# Patient Record
Sex: Male | Born: 1970 | Race: White | Hispanic: No | Marital: Married | Smoking: Former smoker
Health system: Southern US, Community
[De-identification: ages and names within clinical notes are randomized; demographics above are authoritative.]

## PROBLEM LIST (undated history)

## (undated) DIAGNOSIS — R7989 Other specified abnormal findings of blood chemistry: Secondary | ICD-10-CM

## (undated) DIAGNOSIS — R945 Abnormal results of liver function studies: Secondary | ICD-10-CM

## (undated) DIAGNOSIS — T7840XA Allergy, unspecified, initial encounter: Secondary | ICD-10-CM

## (undated) DIAGNOSIS — N200 Calculus of kidney: Secondary | ICD-10-CM

## (undated) DIAGNOSIS — A630 Anogenital (venereal) warts: Secondary | ICD-10-CM

## (undated) DIAGNOSIS — E663 Overweight: Secondary | ICD-10-CM

## (undated) DIAGNOSIS — N62 Hypertrophy of breast: Secondary | ICD-10-CM

## (undated) DIAGNOSIS — J45909 Unspecified asthma, uncomplicated: Secondary | ICD-10-CM

## (undated) HISTORY — DX: Other specified abnormal findings of blood chemistry: R79.89

## (undated) HISTORY — DX: Unspecified asthma, uncomplicated: J45.909

## (undated) HISTORY — PX: HERNIA REPAIR: SHX51

## (undated) HISTORY — DX: Abnormal results of liver function studies: R94.5

## (undated) HISTORY — DX: Calculus of kidney: N20.0

## (undated) HISTORY — DX: Overweight: E66.3

## (undated) HISTORY — DX: Allergy, unspecified, initial encounter: T78.40XA

## (undated) HISTORY — DX: Anogenital (venereal) warts: A63.0

## (undated) HISTORY — DX: Hypertrophy of breast: N62

---

## 2005-02-09 ENCOUNTER — Emergency Department (HOSPITAL_COMMUNITY): Admission: EM | Admit: 2005-02-09 | Discharge: 2005-02-09 | Payer: Self-pay | Admitting: Emergency Medicine

## 2009-10-24 ENCOUNTER — Encounter: Admission: RE | Admit: 2009-10-24 | Discharge: 2009-10-24 | Payer: Self-pay | Admitting: Emergency Medicine

## 2011-02-08 ENCOUNTER — Ambulatory Visit (INDEPENDENT_AMBULATORY_CARE_PROVIDER_SITE_OTHER): Payer: BC Managed Care – PPO

## 2011-02-08 DIAGNOSIS — Z23 Encounter for immunization: Secondary | ICD-10-CM

## 2011-02-24 ENCOUNTER — Encounter: Payer: Self-pay | Admitting: *Deleted

## 2011-02-24 ENCOUNTER — Emergency Department (HOSPITAL_COMMUNITY)
Admission: EM | Admit: 2011-02-24 | Discharge: 2011-02-24 | Disposition: A | Discharge status: Home / Self Care | Payer: BC Managed Care – PPO | Attending: Emergency Medicine | Admitting: Emergency Medicine

## 2011-02-24 DIAGNOSIS — S61409A Unspecified open wound of unspecified hand, initial encounter: Secondary | ICD-10-CM | POA: Insufficient documentation

## 2011-02-24 DIAGNOSIS — S61411A Laceration without foreign body of right hand, initial encounter: Secondary | ICD-10-CM

## 2011-02-24 DIAGNOSIS — W268XXA Contact with other sharp object(s), not elsewhere classified, initial encounter: Secondary | ICD-10-CM | POA: Insufficient documentation

## 2011-02-24 MED ORDER — LIDOCAINE-EPINEPHRINE 2 %-1:100000 IJ SOLN
30.0000 mL | Freq: Once | INTRAMUSCULAR | Status: AC
  Administered 2011-02-24: 30 mL

## 2011-02-24 NOTE — ED Provider Notes (Signed)
History     CSN: [id number]  Arrival date & time 02/24/11  0022   First MD Initiated Contact with Patient 02/24/11 0309      Chief Complaint  Patient presents with  . Extremity Laceration    (Consider location/radiation/quality/duration/timing/severity/associated sxs/prior treatment) The history is provided by the patient.   the patient reports laceration to his right thumb approximately 4 hours ago when he was fixing his stove.  His tetanus was last updated in 2008.  Complains of no weakness numbness or tingling of his right thumb.  He reports a small amount of bleeding.  He has no known medical problems.  His had no difficulty flexing or extending his right thumb.  He has no other complaints.   History reviewed. No pertinent past medical history.  History reviewed. No pertinent past surgical history.  History reviewed. No pertinent family history.  History  Substance Use Topics  . Smoking status: Never Smoker   . Smokeless tobacco: Not on file  . Alcohol Use: Yes      Review of Systems  All other systems reviewed and are negative.    Allergies  Review of patient's allergies indicates no known allergies.  Home Medications  No current outpatient prescriptions on file.  BP 171/85  Pulse 90  Temp(Src) 98 F (36.7 C) (Oral)  Resp 20  SpO2 98%  Physical Exam  Constitutional: He is oriented to person, place, and time. He appears well-developed and well-nourished.  HENT:  Head: Normocephalic.  Eyes: EOM are normal.  Neck: Normal range of motion.  Pulmonary/Chest: Effort normal.  Musculoskeletal: Normal range of motion.       Patient with small V-shaped laceration overlying his proximal phalanx of his right thumb noted distal aspect.  It does appear to be on the radial side.  The laceration was evaluated at the bedside and explored locally without obvious involvement of flexor tendon or extensor tendon.  He has normal flexion of his right thumb at both the IP and  the MCP joint and normal extension at these joints as well.  He has normal distal perfusion and no numbness.  There is no active bleeding at this time  Neurological: He is alert and oriented to person, place, and time.  Psychiatric: He has a normal mood and affect.    ED Course  Procedures (including critical care time)  LACERATION REPAIR Performed by: Lyanne Co Consent: Verbal consent obtained. Risks and benefits: risks, benefits and alternatives were discussed Patient identity confirmed: provided demographic data Time out performed prior to procedure Prepped and Draped in normal sterile fashion Wound explored Laceration Location: right thumb Laceration Length: 2.5cm No Foreign Bodies seen or palpated Anesthesia: local infiltration Local anesthetic: lidocaine 2% with epinephrine Anesthetic total: 3 ml Irrigation method: syringe Amount of cleaning: standard Skin closure: 4-0 prolene Number of sutures or staples: 4 Technique: simple interrupted Patient tolerance: Patient tolerated the procedure well with no immediate complications.   Labs Reviewed - No data to display No results found.   1. Laceration of right hand       MDM  Laceration repaired.  Infection warnings given.  Tetanus up to date.  No tendon involvement.  No foreign bodies noted        Lyanne Co, MD 02/24/11 [phone]

## 2011-02-24 NOTE — ED Notes (Signed)
Pt in c/o laceration to right thumb, bleeding controlled

## 2011-02-24 NOTE — ED Notes (Signed)
MD at bedside. 

## 2011-03-05 ENCOUNTER — Ambulatory Visit (INDEPENDENT_AMBULATORY_CARE_PROVIDER_SITE_OTHER): Payer: BC Managed Care – PPO

## 2011-03-05 DIAGNOSIS — Z4802 Encounter for removal of sutures: Secondary | ICD-10-CM

## 2011-03-05 DIAGNOSIS — S61209A Unspecified open wound of unspecified finger without damage to nail, initial encounter: Secondary | ICD-10-CM

## 2011-05-17 ENCOUNTER — Encounter: Payer: Self-pay | Admitting: Family Medicine

## 2011-05-17 ENCOUNTER — Ambulatory Visit (INDEPENDENT_AMBULATORY_CARE_PROVIDER_SITE_OTHER): Payer: BC Managed Care – PPO | Admitting: Family Medicine

## 2011-05-17 VITALS — BP 132/91 | HR 68 | Temp 98.0°F | Resp 16 | Ht 69.0 in | Wt 269.0 lb

## 2011-05-17 DIAGNOSIS — E669 Obesity, unspecified: Secondary | ICD-10-CM

## 2011-05-17 DIAGNOSIS — R600 Localized edema: Secondary | ICD-10-CM

## 2011-05-17 DIAGNOSIS — Z0000 Encounter for general adult medical examination without abnormal findings: Secondary | ICD-10-CM

## 2011-05-17 LAB — POCT UA - MICROSCOPIC ONLY
Bacteria, U Microscopic: NEGATIVE
Casts, Ur, LPF, POC: NEGATIVE
Crystals, Ur, HPF, POC: NEGATIVE
Mucus, UA: NEGATIVE
WBC, Ur, HPF, POC: NEGATIVE
Yeast, UA: NEGATIVE

## 2011-05-17 LAB — CBC WITH DIFFERENTIAL/PLATELET
Basophils Absolute: 0 10*3/uL (ref 0.0–0.1)
Basophils Relative: 0 % (ref 0–1)
Eosinophils Absolute: 0.1 10*3/uL (ref 0.0–0.7)
Eosinophils Relative: 2 % (ref 0–5)
HCT: 45.4 % (ref 39.0–52.0)
Hemoglobin: 15.7 g/dL (ref 13.0–17.0)
Lymphocytes Relative: 29 % (ref 12–46)
Lymphs Abs: 1.5 10*3/uL (ref 0.7–4.0)
MCH: 29.3 pg (ref 26.0–34.0)
MCHC: 34.6 g/dL (ref 30.0–36.0)
MCV: 84.9 fL (ref 78.0–100.0)
Monocytes Absolute: 0.3 10*3/uL (ref 0.1–1.0)
Monocytes Relative: 7 % (ref 3–12)
Neutro Abs: 3.2 10*3/uL (ref 1.7–7.7)
Neutrophils Relative %: 62 % (ref 43–77)
Platelets: 231 10*3/uL (ref 150–400)
RBC: 5.35 MIL/uL (ref 4.22–5.81)
RDW: 12.7 % (ref 11.5–15.5)
WBC: 5.1 10*3/uL (ref 4.0–10.5)

## 2011-05-17 LAB — POCT URINALYSIS DIPSTICK
Bilirubin, UA: NEGATIVE
Blood, UA: NEGATIVE
Glucose, UA: NEGATIVE
Ketones, UA: NEGATIVE
Leukocytes, UA: NEGATIVE
Nitrite, UA: NEGATIVE
Protein, UA: NEGATIVE
Spec Grav, UA: <=1.005
Urobilinogen, UA: 0.2
pH, UA: 6

## 2011-05-17 LAB — POCT GLYCOSYLATED HEMOGLOBIN (HGB A1C): Hemoglobin A1C: 5.2

## 2011-05-17 NOTE — Progress Notes (Signed)
41 yo married man with two girls (4 and 15 mos), sedentary job.  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  Patient seems committed to this and will begin an exercise program now.  Plan: Check routine labs  Followup 6 months

## 2011-05-17 NOTE — Patient Instructions (Signed)
Health Maintenance, Males A healthy lifestyle and preventative care can promote health and wellness.  Maintain regular health, dental, and eye exams.   Eat a healthy diet. Foods like vegetables, fruits, whole grains, low-fat dairy products, and lean protein foods contain the nutrients you need without too many calories. Decrease your intake of foods high in solid fats, added sugars, and salt. Get information about a proper diet from your caregiver, if necessary.   Regular physical exercise is one of the most important things you can do for your health. Most adults should get at least 150 minutes of moderate-intensity exercise (any activity that increases your heart rate and causes you to sweat) each week. In addition, most adults need muscle-strengthening exercises on 2 or more days a week.    Maintain a healthy weight. The body mass index (BMI) is a screening tool to identify possible weight problems. It provides an estimate of body fat based on height and weight. Your caregiver can help determine your BMI, and can help you achieve or maintain a healthy weight. For adults 20 years and older:   A BMI below 18.5 is considered underweight.   A BMI of 18.5 to 24.9 is normal.   A BMI of 25 to 29.9 is considered overweight.   A BMI of 30 and above is considered obese.   Maintain normal blood lipids and cholesterol by exercising and minimizing your intake of saturated fat. Eat a balanced diet with plenty of fruits and vegetables. Blood tests for lipids and cholesterol should begin at age 20 and be repeated every 5 years. If your lipid or cholesterol levels are high, you are over 50, or you are a high risk for heart disease, you may need your cholesterol levels checked more frequently.Ongoing high lipid and cholesterol levels should be treated with medicines, if diet and exercise are not effective.   If you smoke, find out from your caregiver how to quit. If you do not use tobacco, do not start.    If you choose to drink alcohol, do not exceed 2 drinks per day. One drink is considered to be 12 ounces (355 mL) of beer, 5 ounces (148 mL) of wine, or 1.5 ounces (44 mL) of liquor.   Avoid use of street drugs. Do not share needles with anyone. Ask for help if you need support or instructions about stopping the use of drugs.   High blood pressure causes heart disease and increases the risk of stroke. Blood pressure should be checked at least every 1 to 2 years. Ongoing high blood pressure should be treated with medicines if weight loss and exercise are not effective.   If you are 45 to 40 years old, ask your caregiver if you should take aspirin to prevent heart disease.   Diabetes screening involves taking a blood sample to check your fasting blood sugar level. This should be done once every 3 years, after age 45, if you are within normal weight and without risk factors for diabetes. Testing should be considered at a younger age or be carried out more frequently if you are overweight and have at least 1 risk factor for diabetes.   Colorectal cancer can be detected and often prevented. Most routine colorectal cancer screening begins at the age of 50 and continues through age 75. However, your caregiver may recommend screening at an earlier age if you have risk factors for colon cancer. On a yearly basis, your caregiver may provide home test kits to check for hidden   blood in the stool. Use of a small camera at the end of a tube, to directly examine the colon (sigmoidoscopy or colonoscopy), can detect the earliest forms of colorectal cancer. Talk to your caregiver about this at age 50, when routine screening begins. Direct examination of the colon should be repeated every 5 to 10 years through age 75, unless early forms of pre-cancerous polyps or small growths are found.   Hepatitis C blood testing is recommended for all people born from 1945 through 1965 and any individual with known risks for  hepatitis C.   Healthy men should no longer receive prostate-specific antigen (PSA) blood tests as part of routine cancer screening. Consult with your caregiver about prostate cancer screening.   Testicular cancer screening is not recommended for adolescents or adult males who have no symptoms. Screening includes self-exam, caregiver exam, and other screening tests. Consult with your caregiver about any symptoms you have or any concerns you have about testicular cancer.   Practice safe sex. Use condoms and avoid high-risk sexual practices to reduce the spread of sexually transmitted infections (STIs).   Use sunscreen with a sun protection factor (SPF) of 30 or greater. Apply sunscreen liberally and repeatedly throughout the day. You should seek shade when your shadow is shorter than you. Protect yourself by wearing long sleeves, pants, a wide-brimmed hat, and sunglasses year round, whenever you are outdoors.   Notify your caregiver of new moles or changes in moles, especially if there is a change in shape or color. Also notify your caregiver if a mole is larger than the size of a pencil eraser.   A one-time screening for abdominal aortic aneurysm (AAA) and surgical repair of large AAAs by sound wave imaging (ultrasonography) is recommended for ages 65 to 75 years who are current or former smokers.   Stay current with your immunizations.  Document Released: 08/11/2007 Document Revised: 02/01/2011 Document Reviewed: 07/10/2010 ExitCare Patient Information 2012 [employer]. 

## 2011-05-18 LAB — BASIC METABOLIC PANEL
BUN: 11 mg/dL (ref 6–23)
CO2: 25 mEq/L (ref 19–32)
Calcium: 8.9 mg/dL (ref 8.4–10.5)
Chloride: 103 mEq/L (ref 96–112)
Creat: 0.79 mg/dL (ref 0.50–1.35)
Glucose, Bld: 87 mg/dL (ref 70–99)
Potassium: 4 mEq/L (ref 3.5–5.3)
Sodium: 138 mEq/L (ref 135–145)

## 2011-05-18 LAB — LIPID PANEL
Cholesterol: 185 mg/dL (ref 0–200)
HDL: 41 mg/dL (ref >39)
LDL Cholesterol: 123 mg/dL — ABNORMAL HIGH (ref 0–99)
Total CHOL/HDL Ratio: 4.5 Ratio
Triglycerides: 106 mg/dL (ref <150)
VLDL: 21 mg/dL (ref 0–40)

## 2011-05-18 LAB — TSH: TSH: 0.868 u[IU]/mL (ref 0.350–4.500)

## 2012-03-18 ENCOUNTER — Encounter: Payer: BC Managed Care – PPO | Admitting: Family Medicine

## 2012-04-11 ENCOUNTER — Encounter: Payer: BC Managed Care – PPO | Admitting: Family Medicine

## 2012-04-15 ENCOUNTER — Ambulatory Visit (INDEPENDENT_AMBULATORY_CARE_PROVIDER_SITE_OTHER): Payer: BC Managed Care – PPO | Admitting: Family Medicine

## 2012-04-15 VITALS — BP 134/82 | HR 75 | Temp 98.2°F | Resp 18 | Ht 69.0 in | Wt 271.4 lb

## 2012-04-15 DIAGNOSIS — M549 Dorsalgia, unspecified: Secondary | ICD-10-CM

## 2012-04-15 DIAGNOSIS — Z0000 Encounter for general adult medical examination without abnormal findings: Secondary | ICD-10-CM

## 2012-04-15 LAB — COMPREHENSIVE METABOLIC PANEL
ALT: 71 U/L — ABNORMAL HIGH (ref 0–53)
AST: 38 U/L — ABNORMAL HIGH (ref 0–37)
CO2: 27 mEq/L (ref 19–32)
Calcium: 9.6 mg/dL (ref 8.4–10.5)
Chloride: 102 mEq/L (ref 96–112)
Sodium: 138 mEq/L (ref 135–145)
Total Bilirubin: 0.7 mg/dL (ref 0.3–1.2)
Total Protein: 7.5 g/dL (ref 6.0–8.3)

## 2012-04-15 LAB — LIPID PANEL
Cholesterol: 195 mg/dL (ref 0–200)
Total CHOL/HDL Ratio: 4.6 Ratio
VLDL: 19 mg/dL (ref 0–40)

## 2012-04-15 LAB — POCT UA - MICROSCOPIC ONLY: Yeast, UA: NEGATIVE

## 2012-04-15 LAB — POCT CBC
Granulocyte percent: 62 %G (ref 37–80)
HCT, POC: 51 % (ref 43.5–53.7)
MID (cbc): 0.3 (ref 0–0.9)
MPV: 10.4 fL (ref 0–99.8)
POC Granulocyte: 4.3 (ref 2–6.9)
POC LYMPH PERCENT: 33 %L (ref 10–50)
POC MID %: 5 %M (ref 0–12)
Platelet Count, POC: 275 10*3/uL (ref 142–424)
RDW, POC: 13.7 %

## 2012-04-15 LAB — POCT URINALYSIS DIPSTICK
Ketones, UA: 15
Leukocytes, UA: NEGATIVE
Nitrite, UA: NEGATIVE

## 2012-04-15 LAB — IFOBT (OCCULT BLOOD): IFOBT: NEGATIVE

## 2012-04-15 NOTE — Patient Instructions (Signed)
Work hard on weight loss.  Exercise.  I will let you know the results of your labs.

## 2012-04-15 NOTE — Progress Notes (Signed)
Physical examination:   History: Patient is here for physical examination. He has a form that his job place requires for him to get filled out for strep a certain point system the day following monitoring health status.  Past medical history: Operations: Hernia Medical illnesses: None Current medications: None Medication allergies: None  Family history: Father possibly has diabetes. They are estranged. The father lives in [place]. Mother is living and well. He has 2 children are living and well.   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 His wife is working on a IT trainer. They have 2 children.  Review of systems: Constitutional: Unremarkable HEENT: Unremarkable Respiratory: Unremarkable Cardiovascular: Unremarkable GI: Has a lot of rumbling borborygma. Genitourinary: Unremarkable Muscular skeletal: Unremarkable Dermatologic: Unremarkable Neurologic: Unremarkable Psychiatric: Unremarkable   Physical examination: Morbidly obese man in no major distress. His TMs are normal. Eyes PERRLA. Fundi benign. He has fairly quite prominent discs, it was sent to an ophthalmologist before and was told this was normal. His throat is clear. Teeth good. Neck supple without nodes thyromegaly. No carotid bruits. Chest clear to auscultation. Heart regular without murmurs gallops or arrhythmias. Abdomen soft without mass or tenderness. Normal male external genitalia testes descended. No hernias. Skin warm and dry. Digital rectal exam his prostate gland to be normal with no nodules. He said he had some hemorrhoids in the past but I do not feel any this time. Extremities unremarkable. Pedal pulses good. Skin normal.  Assessment: Complete physical examination Obesity   Plan: Results for orders placed in visit on 04/15/12  POCT CBC      Result Value Range   WBC 6.9  4.6 - 10.2 K/uL   Lymph, poc 2.3  0.6 - 3.4   POC LYMPH PERCENT 33.0  10 - 50 %L    MID (cbc) 0.3  0 - 0.9   POC MID % 5.0  0 - 12 %M   POC Granulocyte 4.3  2 - 6.9   Granulocyte percent 62.0  37 - 80 %G   RBC 5.75  4.69 - 6.13 M/uL   Hemoglobin 16.9  14.1 - 18.1 g/dL   HCT, POC 40.9  81.1 - 53.7 %   MCV 88.7  80 - 97 fL   MCH, POC 29.4  27 - 31.2 pg   MCHC 33.1  31.8 - 35.4 g/dL   RDW, POC 91.4     Platelet Count, POC 275  142 - 424 K/uL   MPV 10.4  0 - 99.8 fL  POCT UA - MICROSCOPIC ONLY      Result Value Range   WBC, Ur, HPF, POC 0-1     RBC, urine, microscopic 0-1     Bacteria, U Microscopic negative     Mucus, UA negative     Epithelial cells, urine per micros 0-1     Crystals, Ur, HPF, POC negative     Casts, Ur, LPF, POC negative     Yeast, UA negative    POCT URINALYSIS DIPSTICK      Result Value Range   Color, UA yellow     Clarity, UA clear     Glucose, UA negative     Bilirubin, UA small     Ketones, UA 15     Spec Grav, UA 1.025     Blood, UA trace-intact     pH, UA 5.5     Protein, UA negative     Urobilinogen,  UA 0.2     Nitrite, UA negative     Leukocytes, UA Negative    IFOBT (OCCULT BLOOD)      Result Value Range   IFOBT Negative

## 2012-04-16 ENCOUNTER — Telehealth: Payer: Self-pay | Admitting: Family Medicine

## 2012-04-16 NOTE — Telephone Encounter (Signed)
Not sure why this stayed open.

## 2012-04-17 ENCOUNTER — Encounter: Payer: Self-pay | Admitting: Family Medicine

## 2012-05-26 ENCOUNTER — Ambulatory Visit (INDEPENDENT_AMBULATORY_CARE_PROVIDER_SITE_OTHER): Payer: BC Managed Care – PPO | Admitting: Family Medicine

## 2012-05-26 ENCOUNTER — Ambulatory Visit: Payer: BC Managed Care – PPO

## 2012-05-26 VITALS — BP 150/92 | HR 88 | Temp 97.9°F | Resp 20

## 2012-05-26 DIAGNOSIS — M549 Dorsalgia, unspecified: Secondary | ICD-10-CM

## 2012-05-26 MED ORDER — CYCLOBENZAPRINE HCL 10 MG PO TABS: 10.0000 mg | ORAL_TABLET | Freq: Three times a day (TID) | ORAL | Status: DC | PRN

## 2012-05-26 MED ORDER — IBUPROFEN 800 MG PO TABS: 800.0000 mg | ORAL_TABLET | Freq: Three times a day (TID) | ORAL | Status: DC | PRN

## 2012-05-26 NOTE — Progress Notes (Signed)
Subjective:    Patient ID: Mathew Greer, male    DOB: Apr 26, 1970, 42 y.o.   MRN: [id number]  HPI This 42 y.o. male presents for evaluation of low back pain.  Symptoms began this morning at work as he rose from a sitting position.  His pain was minimal while he drove home, met his wife and presented to the office.  He was comfortable lying on a bench in our waiting room, but developed severe pain upon rising.  The pain is coming in waves of spasm, with periods of no or minimal pain between.  No saddle anesthesia, radicular pain, paresthesias, loss of bowel or bladder control. Pain occurs with raising the RIGHT leg.  Similar event x 2 previously, most recently in 2007.  Improved with ibuprofen and PT.   Past Medical History  Diagnosis Date  . Overweight   . Allergy     Past Surgical History  Procedure Laterality Date  . Hernia repair  2nd grade    Prior to Admission medications   Medication Sig Start Date End Date Taking? Authorizing Provider  fexofenadine-pseudoephedrine (ALLEGRA-D 24) 180-240 MG per 24 hr tablet Take 1 tablet by mouth daily.   Yes Historical Provider, MD  ibuprofen (ADVIL,MOTRIN) 200 MG tablet Take 200 mg by mouth every 6 (six) hours as needed for pain.    Historical Provider, MD    No Known Allergies  History   Social History  . Marital Status: Married    Spouse Name: Maralyn Sago    Number of Children: 2  . Years of Education: 18+   Occupational History  . Geographical Information  Science    Social History Main Topics  . Smoking status: Former Games developer  . Smokeless tobacco: Never Used  . Alcohol Use: 1.8 oz/week    3 Glasses of wine per week  . Drug Use: No  . Sexually Active: Yes -- Male partner(s)    Birth Control/ Protection: None   Other Topics Concern  . Not on file   Social History Narrative   Working on PhD.  Lives with his wife and their 2 daughters.    Family History  Problem Relation Age of Onset  . Diabetes Father   . Stroke Maternal  Grandmother   . Cancer Maternal Grandfather     lung  . Alzheimer's disease Paternal Grandmother   . Cancer Paternal Grandfather     lung     Review of Systems As above    Objective:   Physical Exam  Blood pressure 150/92, pulse 88, temperature 97.9 F (36.6 C), temperature source Oral, resp. rate 20, SpO2 98.00%. There is no weight on file to calculate BMI. Well-developed, well nourished WM who is awake, alert and oriented, in obvious discomfort.  He stands perched against the exam table, moves gingerly, and intermittently cries out in pain. HEENT: Wiley/AT, sclera and conjunctiva are clear.   Heart: RRR, no murmur Lungs: normal effort, CTA Back: Tenderness of the lumbosacral back, over the vertebrae and to the RIGHT. Neurologic:  Patellar reflexes are symmetrically strong, lower extremity sensation is intact.  Good strength.  Straight leg raise causes pain in the back, but no radicular symptoms. Extremities: no cyanosis, clubbing or edema. Skin: warm and dry without rash. Psychologic: good mood and appropriate affect, normal speech and behavior.   [facility] reading (PRIMARY) by  Dr. Patsy Lager.  Normal LS Spine.      Assessment & Plan:  Acute back pain/strain - Plan: DG Lumbar Spine Complete, cyclobenzaprine (FLEXERIL) 10  MG tablet, ibuprofen (ADVIL,MOTRIN) 800 MG tablet, Ambulatory referral to Physical Therapy.  Anticipatory guidance provided.

## 2012-05-26 NOTE — Patient Instructions (Signed)
If you have not heard anything regarding the referral in 3 days, please contact our office.

## 2012-10-14 ENCOUNTER — Encounter: Payer: Self-pay | Admitting: Internal Medicine

## 2013-03-30 ENCOUNTER — Encounter: Payer: Self-pay | Admitting: Family Medicine

## 2013-03-30 ENCOUNTER — Ambulatory Visit (INDEPENDENT_AMBULATORY_CARE_PROVIDER_SITE_OTHER): Payer: BC Managed Care – PPO | Admitting: Family Medicine

## 2013-03-30 VITALS — BP 134/88 | HR 74 | Temp 98.2°F | Resp 16 | Ht 69.0 in | Wt 267.6 lb

## 2013-03-30 DIAGNOSIS — Z0000 Encounter for general adult medical examination without abnormal findings: Secondary | ICD-10-CM

## 2013-03-30 DIAGNOSIS — Z23 Encounter for immunization: Secondary | ICD-10-CM

## 2013-03-30 LAB — CBC
HCT: 46.5 % (ref 39.0–52.0)
Hemoglobin: 16.2 g/dL (ref 13.0–17.0)
MCH: 29.6 pg (ref 26.0–34.0)
MCHC: 34.8 g/dL (ref 30.0–36.0)
MCV: 85 fL (ref 78.0–100.0)
Platelets: 202 10*3/uL (ref 150–400)
RBC: 5.47 MIL/uL (ref 4.22–5.81)
RDW: 13.2 % (ref 11.5–15.5)
WBC: 5.4 10*3/uL (ref 4.0–10.5)

## 2013-03-30 LAB — COMPREHENSIVE METABOLIC PANEL
ALT: 74 U/L — ABNORMAL HIGH (ref 0–53)
AST: 37 U/L (ref 0–37)
Albumin: 4.6 g/dL (ref 3.5–5.2)
Alkaline Phosphatase: 90 U/L (ref 39–117)
BUN: 15 mg/dL (ref 6–23)
CO2: 24 mEq/L (ref 19–32)
Calcium: 9.6 mg/dL (ref 8.4–10.5)
Chloride: 107 mEq/L (ref 96–112)
Creat: 0.73 mg/dL (ref 0.50–1.35)
Glucose, Bld: 97 mg/dL (ref 70–99)
Potassium: 4.5 mEq/L (ref 3.5–5.3)
Sodium: 141 mEq/L (ref 135–145)
Total Bilirubin: 0.6 mg/dL (ref 0.2–1.2)
Total Protein: 7.2 g/dL (ref 6.0–8.3)

## 2013-03-30 LAB — LIPID PANEL
Cholesterol: 188 mg/dL (ref 0–200)
HDL: 47 mg/dL (ref >39)
LDL Cholesterol: 117 mg/dL — ABNORMAL HIGH (ref 0–99)
Total CHOL/HDL Ratio: 4 Ratio
Triglycerides: 121 mg/dL (ref <150)
VLDL: 24 mg/dL (ref 0–40)

## 2013-03-30 LAB — POCT URINALYSIS DIPSTICK
Bilirubin, UA: NEGATIVE
Blood, UA: NEGATIVE
Glucose, UA: NEGATIVE
Ketones, UA: NEGATIVE
Leukocytes, UA: NEGATIVE
Nitrite, UA: NEGATIVE
Protein, UA: NEGATIVE
Spec Grav, UA: 1.02
Urobilinogen, UA: 0.2
pH, UA: 5

## 2013-03-30 NOTE — Patient Instructions (Signed)

## 2013-03-30 NOTE — Progress Notes (Signed)
Patient ID: Mathew Greer MRN: [id number], DOB: [birth year] 43 y.o. Date of Encounter: 03/30/2013, 10:02 AM  Primary Physician: No primary provider on file.  Chief Complaint: Physical (CPE)  HPI: 43 y.o. y/o male with history noted below here for CPE.  Doing well. He is married, with two daughters aged 893 and 16.  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  He has a special interest in wine. He was in [employer] from 1992-1998, but in geographical science since. He has battled weight issues since discharged from the [employer], weighing about 190 lbs.  He has had best success with Atkins.  His lifestyle is sedentary and he knows he should do more, but his schedule is full with studies and work.  He once tried ephedra while working at [employer], and although this helped, he had problems with energy after stopping it and does not want to go through that again. He's had two episodes of severe back pain in his life, the worst being last year.  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  Review of Systems: Consitutional: No fever, chills, fatigue, night sweats, lymphadenopathy, or weight changes. Eyes: No visual changes, eye redness, or discharge. ENT/Mouth: Ears: No otalgia, tinnitus, hearing loss, discharge. Nose: No congestion, rhinorrhea, sinus pain, or epistaxis. Throat: No sore throat, post nasal drip, or teeth pain. Cardiovascular: No CP, palpitations, diaphoresis, DOE, edema, orthopnea, PND. Respiratory: No cough, hemoptysis, SOB, or wheezing. Gastrointestinal: No anorexia, dysphagia, reflux, pain, nausea, vomiting, hematemesis, diarrhea, constipation, BRBPR, or melena. Genitourinary: No dysuria, frequency, urgency, hematuria, incontinence, nocturia, decreased urinary stream, discharge, impotence, or testicular pain/masses. Musculoskeletal: No decreased ROM, myalgias, stiffness, joint swelling, or weakness. Skin: No rash,  erythema, lesion changes, pain, warmth, jaundice, or pruritis. Neurological: No headache, dizziness, syncope, seizures, tremors, memory loss, coordination problems, or paresthesias. Psychological: No anxiety, depression, hallucinations, SI/HI. Endocrine: No fatigue, polydipsia, polyphagia, polyuria, or known diabetes. All other systems were reviewed and are otherwise negative.  Past Medical History  Diagnosis Date  . Overweight   . Allergy   . Condylomata acuminata   . Asthmatic bronchitis   . Nephrolithiasis   . Gynecomastia   . Elevated LFTs     HEPATITIS PANEL NEGATIVE     Past Surgical History  Procedure Laterality Date  . Hernia repair  2nd grade    Home Meds:  Prior to Admission medications   Medication Sig Start Date End Date Taking? Authorizing Provider  fexofenadine-pseudoephedrine (ALLEGRA-D 24) 180-240 MG per 24 hr tablet Take 1 tablet by mouth daily.   Yes Historical Provider, MD    Allergies: No Known Allergies  History   Social History  . Marital Status: Married    Spouse Name: Maralyn SagoSarah    Number of Children: 2  . Years of Education: 18+   Occupational History  . Geographical Information  Science    Social History Main Topics  . Smoking status: Former Games developermoker  . Smokeless tobacco: Never Used  . Alcohol Use: 1.8 oz/week    3 Glasses of wine per week  . Drug Use: No  . Sexual Activity: Yes    Partners: Female    Pharmacist, hospitalBirth Control/ Protection: None   Other Topics Concern  . Not on file   Social History Narrative   Working on PhD.  Lives with his wife and their 2 daughters.    Family History  Problem Relation Age of  Onset  . Diabetes Father   . Stroke Maternal Grandmother   . Cancer Maternal Grandfather     lung  . Alzheimer's disease Paternal Grandmother   . Cancer Paternal Grandfather     lung    Physical Exam: Blood pressure 134/88, pulse 74, temperature 98.2 F (36.8 C), temperature source Oral, resp. rate 16, height 5\' 9"  (1.753 m),  weight 267 lb 9.6 oz (121.383 kg), SpO2 97.00%.  General: Well developed, well nourished, in no acute distress. HEENT: Normocephalic, atraumatic. Conjunctiva pink, sclera non-icteric. Pupils 2 mm constricting to 1 mm, round, regular, and equally reactive to light and accomodation. EOMI. Internal auditory canal clear. TMs with good cone of light and without pathology. Nasal mucosa pink. Nares are without discharge. No sinus tenderness. Oral mucosa pink. Dentition excellent. Pharynx without exudate.   Neck: Supple. Trachea midline. No thyromegaly. Full ROM. No lymphadenopathy. Lungs: Clear to auscultation bilaterally without wheezes, rales, or rhonchi. Breathing is of normal effort and unlabored. Cardiovascular: RRR with S1 S2. No murmurs, rubs, or gallops appreciated. Distal pulses 2+ symmetrically. No carotid or abdominal bruits Abdomen: Soft, non-tender, non-distended with normoactive bowel sounds. No hepatosplenomegaly or masses. No rebound/guarding. No CVA tenderness. Without hernias.   Genitourinary:  circumcised male. No penile lesions. Testes descended bilaterally, and smooth without tenderness or masses.  Musculoskeletal: Full range of motion and 5/5 strength throughout. Without swelling, atrophy, tenderness, crepitus, or warmth. Extremities without clubbing, cyanosis, or edema. Calves supple. Skin: Warm and moist without erythema, ecchymosis, wounds, or rash. Neuro: A+Ox3. CN II-XII grossly intact. Moves all extremities spontaneously. Full sensation throughout. Normal gait. DTR 2+ throughout upper and lower extremities. Finger to nose intact. Psych:  Responds to questions appropriately with a normal affect.   Studies: CBC, CMET, Lipid pending UA:  Results for orders placed in visit on 03/30/13  POCT URINALYSIS DIPSTICK      Result Value Range   Color, UA yellow     Clarity, UA clear     Glucose, UA neg     Bilirubin, UA neg     Ketones, UA neg     Spec Grav, UA 1.020     Blood, UA neg      pH, UA 5.0     Protein, UA neg     Urobilinogen, UA 0.2     Nitrite, UA neg     Leukocytes, UA Negative       Assessment/Plan:  43 y.o. y/o  male here for CPE - Annual physical exam - Plan: POCT urinalysis dipstick, CBC, Comprehensive metabolic panel, Lipid panel  Need for prophylactic vaccination and inoculation against influenza - Plan: Flu Vaccine QUAD 36+ mos IM   Signed, Elvina Sidle, MD 03/30/2013 10:02 AM

## 2013-09-30 ENCOUNTER — Ambulatory Visit: Payer: BC Managed Care – PPO

## 2013-09-30 ENCOUNTER — Ambulatory Visit (INDEPENDENT_AMBULATORY_CARE_PROVIDER_SITE_OTHER): Payer: BC Managed Care – PPO | Admitting: Family Medicine

## 2013-09-30 ENCOUNTER — Ambulatory Visit (INDEPENDENT_AMBULATORY_CARE_PROVIDER_SITE_OTHER): Payer: BC Managed Care – PPO

## 2013-09-30 VITALS — BP 130/82 | HR 81 | Temp 97.7°F | Resp 16 | Ht 69.0 in | Wt 275.4 lb

## 2013-09-30 DIAGNOSIS — M47894 Other spondylosis, thoracic region: Secondary | ICD-10-CM

## 2013-09-30 DIAGNOSIS — M546 Pain in thoracic spine: Secondary | ICD-10-CM

## 2013-09-30 DIAGNOSIS — R079 Chest pain, unspecified: Secondary | ICD-10-CM

## 2013-09-30 DIAGNOSIS — R748 Abnormal levels of other serum enzymes: Secondary | ICD-10-CM

## 2013-09-30 DIAGNOSIS — M25579 Pain in unspecified ankle and joints of unspecified foot: Secondary | ICD-10-CM

## 2013-09-30 DIAGNOSIS — M4120 Other idiopathic scoliosis, site unspecified: Secondary | ICD-10-CM

## 2013-09-30 DIAGNOSIS — R0781 Pleurodynia: Secondary | ICD-10-CM

## 2013-09-30 DIAGNOSIS — M47814 Spondylosis without myelopathy or radiculopathy, thoracic region: Secondary | ICD-10-CM

## 2013-09-30 LAB — POCT UA - MICROSCOPIC ONLY
Casts, Ur, LPF, POC: NEGATIVE
Crystals, Ur, HPF, POC: NEGATIVE
Mucus, UA: NEGATIVE
Yeast, UA: NEGATIVE

## 2013-09-30 LAB — POCT CBC
Granulocyte percent: 62 %G (ref 37–80)
HCT, POC: 50.6 % (ref 43.5–53.7)
Hemoglobin: 16.8 g/dL (ref 14.1–18.1)
Lymph, poc: 3 (ref 0.6–3.4)
MCH, POC: 29.6 pg (ref 27–31.2)
MCHC: 33.3 g/dL (ref 31.8–35.4)
MCV: 88.8 fL (ref 80–97)
MID (cbc): 0.1 (ref 0–0.9)
MPV: 8.5 fL (ref 0–99.8)
POC Granulocyte: 5.1 (ref 2–6.9)
POC LYMPH PERCENT: 36.4 %L (ref 10–50)
POC MID %: 1.6 % (ref 0–12)
Platelet Count, POC: 240 10*3/uL (ref 142–424)
RBC: 5.7 M/uL (ref 4.69–6.13)
RDW, POC: 13.2 %
WBC: 8.3 10*3/uL (ref 4.6–10.2)

## 2013-09-30 LAB — POCT URINALYSIS DIPSTICK
Bilirubin, UA: NEGATIVE
Blood, UA: NEGATIVE
Glucose, UA: NEGATIVE
Ketones, UA: 15
Leukocytes, UA: NEGATIVE
Nitrite, UA: NEGATIVE
Protein, UA: NEGATIVE
Spec Grav, UA: 1.015
Urobilinogen, UA: 0.2
pH, UA: 5

## 2013-09-30 NOTE — Progress Notes (Signed)
 Chief Complaint:  Chief Complaint  Patient presents with  . Chest Pain    a few months; pain is described as a sharp around the left/right ribs but mostly under the right rib.  pain is worse at night.  No OTC meds being taken; denies using hot/cold compress  . Back Pain    a few months;  pain started in the lower back and moved towards the center  . Foot Pain    pain in both feet around the heel area.  pt indicates pain has gotten worse over the past 3 months    HPI: Mathew Greer is a 43 y.o. male who is here for : 1. 3 month history of foot pain, worse in the AM, better with walking, has tried shoes but no improvement, HE walks barefoot a lot.  2. Several months of feeling his back was full, as if his kidneys were full and thought it was his kidneys he has had a hx of 1 kidney stone. The pain in his kidneys in the morning, then it moved, now he has mid back pain  And also bialteral rib pain, moved to where his ribs connect, back pain is where where the top of a chair back would be, getting worse and bothering him. Last night th epain along the front of hsi rib cage was noticeable enough where he could not fall back to sleep, felt like a  'roll a sock up" and stuck something there. On both sided but primarily his right side. Last night he woke up and was worried and realized he was in pain. Dull, mainly feels like his back needs to be popped. He has pain when he is laying down, he usually take ibuprofen if needed. He was in [facility] for a conference and was sitting in a chair all day for 1 week and this may have made it worse. He has not taken any of this. He is worried about hiatal hernia and wants to [facility] what this could be other than his obesity and msk strain/sprain. He denies CP, SOB, palpitations. Does not smoke. No prematur hx of MI, deneis GERD, denies any injuires, URI sxs; NKI. Denies DM, HTN,Hyperlipidemia  Past Medical History  Diagnosis Date  . Overweight(278.02)   . Allergy    . Condylomata acuminata   . Asthmatic bronchitis   . Nephrolithiasis   . Gynecomastia   . Elevated LFTs     HEPATITIS PANEL NEGATIVE   Past Surgical History  Procedure Laterality Date  . Hernia repair  2nd grade   History   Social History  . Marital Status: Married    Spouse Name: Maralyn Sago    Number of Children: 2  . Years of Education: 18+   Occupational History  . Geographical Information  Science    Social History Main Topics  . Smoking status: Former Games developer  . Smokeless tobacco: Never Used  . Alcohol Use: 1.8 oz/week    3 Glasses of wine per week  . Drug Use: No  . Sexual Activity: Yes    Partners: Female    Pharmacist, hospital Protection: None   Other Topics Concern  . None   Social History Narrative   Working on PhD.  Lives with his wife and their 2 daughters.   Family History  Problem Relation Age of Onset  . Diabetes Father   . Stroke Maternal Grandmother   . Cancer Maternal Grandfather     lung  . Alzheimer's  disease Paternal Grandmother   . Cancer Paternal Grandfather     lung   No Known Allergies Prior to Admission medications   Medication Sig Start Date End Date Taking? Authorizing Provider  fexofenadine-pseudoephedrine (ALLEGRA-D 24) 180-240 MG per 24 hr tablet Take 1 tablet by mouth daily.   Yes Historical Provider, MD     ROS: The patient denies fevers, chills, night sweats, unintentional weight loss, chest pain, palpitations, wheezing, dyspnea on exertion, nausea, vomiting, abdominal pain, dysuria, hematuria, melena, numbness, weakness, or tingling.   All other systems have been reviewed and were otherwise negative with the exception of those mentioned in the HPI and as above.    PHYSICAL EXAM: Filed Vitals:   09/30/13 1609  BP: 130/82  Pulse: 81  Temp: 97.7 F (36.5 C)  Resp: 16   Filed Vitals:   09/30/13 1609  Height: 5\' 9"  (1.753 m)  Weight: 275 lb 6.4 oz (124.921 kg)   Body mass index is 40.65 kg/(m^2).  General: Alert, no  acute distress, obese male  HEENT:  Normocephalic, atraumatic, oropharynx patent. EOMI, PERRLA, TM nl, no exudates Cardiovascular:  Regular rate and rhythm, no rubs murmurs or gallops.  No Carotid bruits, radial pulse intact. No pedal edema.  Respiratory: Clear to auscultation bilaterally.  No wheezes, rales, or rhonchi.  No cyanosis, no use of accessory musculature GI: No organomegaly, abdomen is soft and non-tender, positive bowel sounds.  No masses. Skin: No rashes. Neurologic: Facial musculature symmetric. Psychiatric: Patient is appropriate throughout our interaction. Lymphatic: No cervical lymphadenopathy Musculoskeletal: Gait intact. + paramsk tenderness  Along bottom of anterior rib cage Full ROM 5/5 strength, 2/2 DTRs Straight leg negative Foot exams bialterally normal    LABS: Results for orders placed in visit on 09/30/13  POCT CBC      Result Value Ref Range   WBC 8.3  4.6 - 10.2 K/uL   Lymph, poc 3.0  0.6 - 3.4   POC LYMPH PERCENT 36.4  10 - 50 %L   MID (cbc) 0.1  0 - 0.9   POC MID % 1.6  0 - 12 %M   POC Granulocyte 5.1  2 - 6.9   Granulocyte percent 62.0  37 - 80 %G   RBC 5.70  4.69 - 6.13 M/uL   Hemoglobin 16.8  14.1 - 18.1 g/dL   HCT, POC 16.1  09.6 - 53.7 %   MCV 88.8  80 - 97 fL   MCH, POC 29.6  27 - 31.2 pg   MCHC 33.3  31.8 - 35.4 g/dL   RDW, POC 04.5     Platelet Count, POC 240  142 - 424 K/uL   MPV 8.5  0 - 99.8 fL  POCT UA - MICROSCOPIC ONLY      Result Value Ref Range   WBC, Ur, HPF, POC 0-1     RBC, urine, microscopic 0-1     Bacteria, U Microscopic trace     Mucus, UA neg     Epithelial cells, urine per micros 0-2     Crystals, Ur, HPF, POC neg     Casts, Ur, LPF, POC neg     Yeast, UA neg    POCT URINALYSIS DIPSTICK      Result Value Ref Range   Color, UA yellow     Clarity, UA clear     Glucose, UA neg     Bilirubin, UA neg     Ketones, UA 15     Spec Grav, UA  1.015     Blood, UA neg     pH, UA 5.0     Protein, UA neg      Urobilinogen, UA 0.2     Nitrite, UA neg     Leukocytes, UA Negative       EKG/XRAY:   [facility] 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    ASSESSMENT/PLAN: Encounter Diagnoses  Name Primary?  . Pain in joint, ankle and foot, unspecified laterality   . Rib pain on right side   . Right-sided thoracic back pain Yes  . Elevated liver enzymes    Sprain and strain, costochondritis, and PF ROM exercise, Shoe Inserts, NSAIDs for rib/back pain and Plantar Fasciitis Precautions to go to ER prn  CMP pending F/u prn  Gross sideeffects, risk and benefits, and alternatives of medications d/w patient. Patient is aware that all medications have potential sideeffects and we are unable to predict every sideeffect or drug-drug interaction that may occur.  Rockne CoonsLE,  PHUONG, DO 09/30/2013 6:39 PM

## 2013-09-30 NOTE — Patient Instructions (Signed)
Plantar Fasciitis (Heel Spur Syndrome) with Rehab The plantar fascia is a fibrous, ligament-like, soft-tissue structure that spans the bottom of the foot. Plantar fasciitis is a condition that causes pain in the foot due to inflammation of the tissue. SYMPTOMS   Pain and tenderness on the underneath side of the foot.  Pain that worsens with standing or walking. CAUSES  Plantar fasciitis is caused by irritation and injury to the plantar fascia on the underneath side of the foot. Common mechanisms of injury include:  Direct trauma to bottom of the foot.  Damage to a small nerve that runs under the foot where the main fascia attaches to the heel bone.  Stress placed on the plantar fascia due to bone spurs. RISK INCREASES WITH:   Activities that place stress on the plantar fascia (running, jumping, pivoting, or cutting).  Poor strength and flexibility.  Improperly fitted shoes.  Tight calf muscles.  Flat feet.  Failure to warm-up properly before activity.  Obesity. PREVENTION  Warm up and stretch properly before activity.  Allow for adequate recovery between workouts.  Maintain physical fitness:  Strength, flexibility, and endurance.  Cardiovascular fitness.  Maintain a health body weight.  Avoid stress on the plantar fascia.  Wear properly fitted shoes, including arch supports for individuals who have flat feet. PROGNOSIS  If treated properly, then the symptoms of plantar fasciitis usually resolve without surgery. However, occasionally surgery is necessary. RELATED COMPLICATIONS   Recurrent symptoms that may result in a chronic condition.  Problems of the lower back that are caused by compensating for the injury, such as limping.  Pain or weakness of the foot during push-off following surgery.  Chronic inflammation, scarring, and partial or complete fascia tear, occurring more often from repeated injections. TREATMENT  Treatment initially involves the use of  ice and medication to help reduce pain and inflammation. The use of strengthening and stretching exercises may help reduce pain with activity, especially stretches of the Achilles tendon. These exercises may be performed at home or with a therapist. Your caregiver may recommend that you use heel cups of arch supports to help reduce stress on the plantar fascia. Occasionally, corticosteroid injections are given to reduce inflammation. If symptoms persist for greater than 6 months despite non-surgical (conservative), then surgery may be recommended.  MEDICATION   If pain medication is necessary, then nonsteroidal anti-inflammatory medications, such as aspirin and ibuprofen, or other minor pain relievers, such as acetaminophen, are often recommended.  Do not take pain medication within 7 days before surgery.  Prescription pain relievers may be given if deemed necessary by your caregiver. Use only as directed and only as much as you need.  Corticosteroid injections may be given by your caregiver. These injections should be reserved for the most serious cases, because they may only be given a certain number of times. HEAT AND COLD  Cold treatment (icing) relieves pain and reduces inflammation. Cold treatment should be applied for 10 to 15 minutes every 2 to 3 hours for inflammation and pain and immediately after any activity that aggravates your symptoms. Use ice packs or massage the area with a piece of ice (ice massage).  Heat treatment may be used prior to performing the stretching and strengthening activities prescribed by your caregiver, physical therapist, or athletic trainer. Use a heat pack or soak the injury in warm water. SEEK IMMEDIATE MEDICAL CARE IF:  Treatment seems to offer no benefit, or the condition worsens.  Any medications produce adverse side effects. EXERCISES RANGE   OF MOTION (ROM) AND STRETCHING EXERCISES - Plantar Fasciitis (Heel Spur Syndrome) These exercises may help you  when beginning to rehabilitate your injury. Your symptoms may resolve with or without further involvement from your physician, physical therapist or athletic trainer. While completing these exercises, remember:   Restoring tissue flexibility helps normal motion to return to the joints. This allows healthier, less painful movement and activity.  An effective stretch should be held for at least 30 seconds.  A stretch should never be painful. You should only feel a gentle lengthening or release in the stretched tissue. RANGE OF MOTION - Toe Extension, Flexion  Sit with your right / left leg crossed over your opposite knee.  Grasp your toes and gently pull them back toward the top of your foot. You should feel a stretch on the bottom of your toes and/or foot.  Hold this stretch for __________ seconds.  Now, gently pull your toes toward the bottom of your foot. You should feel a stretch on the top of your toes and or foot.  Hold this stretch for __________ seconds. Repeat __________ times. Complete this stretch __________ times per day.  RANGE OF MOTION - Ankle Dorsiflexion, Active Assisted  Remove shoes and sit on a chair that is preferably not on a carpeted surface.  Place right / left foot under knee. Extend your opposite leg for support.  Keeping your heel down, slide your right / left foot back toward the chair until you feel a stretch at your ankle or calf. If you do not feel a stretch, slide your bottom forward to the edge of the chair, while still keeping your heel down.  Hold this stretch for __________ seconds. Repeat __________ times. Complete this stretch __________ times per day.  STRETCH - Gastroc, Standing  Place hands on wall.  Extend right / left leg, keeping the front knee somewhat bent.  Slightly point your toes inward on your back foot.  Keeping your right / left heel on the floor and your knee straight, shift your weight toward the wall, not allowing your back to  arch.  You should feel a gentle stretch in the right / left calf. Hold this position for __________ seconds. Repeat __________ times. Complete this stretch __________ times per day. STRETCH - Soleus, Standing  Place hands on wall.  Extend right / left leg, keeping the other knee somewhat bent.  Slightly point your toes inward on your back foot.  Keep your right / left heel on the floor, bend your back knee, and slightly shift your weight over the back leg so that you feel a gentle stretch deep in your back calf.  Hold this position for __________ seconds. Repeat __________ times. Complete this stretch __________ times per day. STRETCH - Gastrocsoleus, Standing  Note: This exercise can place a lot of stress on your foot and ankle. Please complete this exercise only if specifically instructed by your caregiver.   Place the ball of your right / left foot on a step, keeping your other foot firmly on the same step.  Hold on to the wall or a rail for balance.  Slowly lift your other foot, allowing your body weight to press your heel down over the edge of the step.  You should feel a stretch in your right / left calf.  Hold this position for __________ seconds.  Repeat this exercise with a slight bend in your right / left knee. Repeat __________ times. Complete this stretch __________ times per day.    STRENGTHENING EXERCISES - Plantar Fasciitis (Heel Spur Syndrome)  These exercises may help you when beginning to rehabilitate your injury. They may resolve your symptoms with or without further involvement from your physician, physical therapist or athletic trainer. While completing these exercises, remember:   Muscles can gain both the endurance and the strength needed for everyday activities through controlled exercises.  Complete these exercises as instructed by your physician, physical therapist or athletic trainer. Progress the resistance and repetitions only as guided. STRENGTH -  Towel Curls  Sit in a chair positioned on a non-carpeted surface.  Place your foot on a towel, keeping your heel on the floor.  Pull the towel toward your heel by only curling your toes. Keep your heel on the floor.  If instructed by your physician, physical therapist or athletic trainer, add ____________________ at the end of the towel. Repeat __________ times. Complete this exercise __________ times per day. STRENGTH - Ankle Inversion  Secure one end of a rubber exercise band/tubing to a fixed object (table, pole). Loop the other end around your foot just before your toes.  Place your fists between your knees. This will focus your strengthening at your ankle.  Slowly, pull your big toe up and in, making sure the band/tubing is positioned to resist the entire motion.  Hold this position for __________ seconds.  Have your muscles resist the band/tubing as it slowly pulls your foot back to the starting position. Repeat __________ times. Complete this exercises __________ times per day.  Document Released: 02/12/2005 Document Revised: 05/07/2011 Document Reviewed: 05/27/2008 Exit[employer] Patient Information 2015 [employer]. This information is not intended to replace advice given to you by your health care provider. Make sure you discuss any questions you have with your health care provider.  

## 2013-10-01 LAB — COMPLETE METABOLIC PANEL WITHOUT GFR
AST: 40 U/L — ABNORMAL HIGH (ref 0–37)
Alkaline Phosphatase: 91 U/L (ref 39–117)
BUN: 13 mg/dL (ref 6–23)
Calcium: 9.7 mg/dL (ref 8.4–10.5)
Chloride: 102 meq/L (ref 96–112)
Creat: 0.8 mg/dL (ref 0.50–1.35)

## 2013-10-01 LAB — COMPLETE METABOLIC PANEL WITH GFR
ALT: 87 U/L — ABNORMAL HIGH (ref 0–53)
Albumin: 4.9 g/dL (ref 3.5–5.2)
CO2: 25 mEq/L (ref 19–32)
GFR, Est African American: >89 mL/min
GFR, Est Non African American: >89 mL/min
Glucose, Bld: 85 mg/dL (ref 70–99)
Potassium: 4.3 mEq/L (ref 3.5–5.3)
Sodium: 140 mEq/L (ref 135–145)
Total Bilirubin: 0.7 mg/dL (ref 0.2–1.2)
Total Protein: 7.7 g/dL (ref 6.0–8.3)

## 2014-04-21 ENCOUNTER — Telehealth: Payer: Self-pay | Admitting: *Deleted

## 2014-04-21 ENCOUNTER — Encounter: Payer: Self-pay | Admitting: Family Medicine

## 2014-04-21 ENCOUNTER — Ambulatory Visit (INDEPENDENT_AMBULATORY_CARE_PROVIDER_SITE_OTHER): Payer: BLUE CROSS/BLUE SHIELD | Admitting: Family Medicine

## 2014-04-21 DIAGNOSIS — R0683 Snoring: Secondary | ICD-10-CM

## 2014-04-21 DIAGNOSIS — Z0000 Encounter for general adult medical examination without abnormal findings: Secondary | ICD-10-CM

## 2014-04-21 DIAGNOSIS — Z1389 Encounter for screening for other disorder: Secondary | ICD-10-CM

## 2014-04-21 DIAGNOSIS — Z139 Encounter for screening, unspecified: Secondary | ICD-10-CM

## 2014-04-21 DIAGNOSIS — Z130 Encounter for screening for diseases of the blood and blood-forming organs and certain disorders involving the immune mechanism: Secondary | ICD-10-CM

## 2014-04-21 DIAGNOSIS — Z23 Encounter for immunization: Secondary | ICD-10-CM

## 2014-04-21 LAB — POCT URINALYSIS DIPSTICK
BILIRUBIN UA: NEGATIVE
Blood, UA: NEGATIVE
GLUCOSE UA: NEGATIVE
KETONES UA: NEGATIVE
LEUKOCYTES UA: NEGATIVE
Nitrite, UA: NEGATIVE
PH UA: 5
Protein, UA: NEGATIVE
Spec Grav, UA: 1.025
Urobilinogen, UA: 0.2

## 2014-04-21 LAB — COMPREHENSIVE METABOLIC PANEL
ALT: 89 U/L — ABNORMAL HIGH (ref 0–53)
AST: 39 U/L — ABNORMAL HIGH (ref 0–37)
Albumin: 4.5 g/dL (ref 3.5–5.2)
Alkaline Phosphatase: 100 U/L (ref 39–117)
BILIRUBIN TOTAL: 0.6 mg/dL (ref 0.2–1.2)
BUN: 16 mg/dL (ref 6–23)
CO2: 25 meq/L (ref 19–32)
CREATININE: 0.83 mg/dL (ref 0.50–1.35)
Calcium: 9.4 mg/dL (ref 8.4–10.5)
Chloride: 104 mEq/L (ref 96–112)
GLUCOSE: 95 mg/dL (ref 70–99)
Potassium: 4.6 mEq/L (ref 3.5–5.3)
SODIUM: 139 meq/L (ref 135–145)
TOTAL PROTEIN: 7.4 g/dL (ref 6.0–8.3)

## 2014-04-21 LAB — LIPID PANEL
Cholesterol: 174 mg/dL (ref 0–200)
HDL: 44 mg/dL (ref >=40)
LDL CALC: 109 mg/dL — AB (ref 0–99)
Total CHOL/HDL Ratio: 4 Ratio
Triglycerides: 106 mg/dL (ref <150)
VLDL: 21 mg/dL (ref 0–40)

## 2014-04-21 LAB — HEMOGLOBIN A1C
Hgb A1c MFr Bld: 5.6 % (ref <5.7)
MEAN PLASMA GLUCOSE: 114 mg/dL (ref <117)

## 2014-04-21 NOTE — Progress Notes (Signed)
Subjective:    Patient ID: Mathew Greer, male    DOB: Nov 08, 1970, 44 y.o.   MRN: [id number]  HPI Patient presents today for CPE.  Last CPE- 1 year ago Flu- today Tdap-07/15/2006 Dental- semi annual exams Eye- regular  Doesn't sleep well. Snores loudly and is interested in a sleep study. Goes to bed around 12 midnight and gets up around 7 am. Has a difficult time getting out of bed in the mornings.   He reports that his wife is very health conscious- she does most of the cooking and shopping. He admits difficulty with portion control and exercise. 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 He denies any increase in stress and reports his mood is good. He is a self proclaimed, "couch potato." He reports having access to a gym and home exercise equipment, but lacks the time. He weighs himself daily and logs his weight. He has noticed an increase in the last two years. He attributes this to not having to walk to classes.   Past Medical History  Diagnosis Date  . Overweight(278.02)   . Allergy   . Condylomata acuminata   . Asthmatic bronchitis   . Nephrolithiasis   . Gynecomastia   . Elevated LFTs     HEPATITIS PANEL NEGATIVE   Past Surgical History  Procedure Laterality Date  . Hernia repair  2nd grade   Family History  Problem Relation Age of Onset  . Diabetes Father   . Depression Father   . Mental illness Father   . Stroke Maternal Grandmother   . Cancer Maternal Grandfather     lung  . Alzheimer's disease Paternal Grandmother   . Cancer Paternal Grandfather     lung   History   Social History  . Marital Status: Married    Spouse Name: Maralyn Sago  . Number of Children: 2  . Years of Education: 18+   Occupational History  . Geographical Information  Science    Social History Main Topics  . Smoking status: Former Games developer  . Smokeless tobacco: Never Used  . Alcohol Use: 1.8 oz/week    3 Glasses of wine per week  . Drug Use: No  . Sexual  Activity:    Partners: Female    Copy: None   Other Topics Concern  . Not on file   Social History Narrative   Working on PhD.  Lives with his wife and their 2 daughters.     Review of Systems  Constitutional: Negative.   HENT: Negative.   Eyes: Negative.   Respiratory: Negative.   Cardiovascular: Negative.   Gastrointestinal: Negative.   Endocrine: Negative.   Genitourinary: Negative.   Musculoskeletal: Negative.   Skin: Negative.   Allergic/Immunologic: Negative.   Neurological: Negative.   Hematological: Negative.   Psychiatric/Behavioral: Negative.       Objective:   Physical Exam  Constitutional: He is oriented to person, place, and time. He appears well-developed and well-nourished.  HENT:  Head: Normocephalic and atraumatic.  Right Ear: External ear normal.  Left Ear: External ear normal.  Nose: Nose normal.  Mouth/Throat: Oropharynx is clear and moist. No oropharyngeal exudate.  Eyes: Conjunctivae are normal. Pupils are equal, round, and reactive to light.  Neck: Normal range of motion. Neck supple. No thyromegaly present.  Cardiovascular: Normal rate, regular rhythm and normal heart sounds.   Pulmonary/Chest: Effort normal and breath sounds normal.  Abdominal: Soft. Bowel sounds are normal.  Genitourinary:  Testes normal and penis normal. Circumcised.  Musculoskeletal: Normal range of motion. He exhibits no edema or tenderness.  Lymphadenopathy:    He has no cervical adenopathy.  Neurological: He is alert and oriented to person, place, and time.  Skin: Skin is warm and dry.  Psychiatric: He has a normal mood and affect. His behavior is normal. Judgment and thought content normal.  Vitals reviewed.  BP 124/78 mmHg  Pulse 80  Temp(Src) 98.4 F (36.9 C)  Resp 16  Ht 5' 9.5" (1.765 m)  Wt 283 lb (128.368 kg)  BMI 41.21 kg/m2  SpO2 96%     Assessment & Plan:  1. Annual physical exam   2. Severe obesity (BMI >= 40) -  Comprehensive metabolic panel - Lipid panel - Hemoglobin A1c - Ambulatory referral to Pulmonology - Discussed portion control, building small amounts of exercise (aerobic, weightbearing, stretching) into daily activities.  3. Screening for deficiency anemia - CBC  4. Screening for hematuria or proteinuria  POCT urinalysis dipstick  5. Flu vaccine need - Flu Vaccine QUAD 36+ mos IM  6. Snoring - Ambulatory referral to Pulmonology for eval of OSA   Emi Belfasteborah B. Zaida Reiland, FNP-BC  [facility], [facility]  04/21/2014 9:51 AM

## 2014-04-21 NOTE — Patient Instructions (Signed)
Increase movement every day- every bit counts! Watch portion sizes Weigh yourself regularly

## 2014-04-21 NOTE — Telephone Encounter (Signed)
Patient was called to pick up completed verification of flu vaccine form at [address]. The form needs to be signed and then faxed by patient to company.

## 2014-04-22 LAB — CBC
HCT: 48.5 % (ref 39.0–52.0)
HEMOGLOBIN: 16.2 g/dL (ref 13.0–17.0)
MCH: 29.5 pg (ref 26.0–34.0)
MCHC: 33.4 g/dL (ref 30.0–36.0)
MCV: 88.2 fL (ref 78.0–100.0)
MPV: 10.5 fL (ref 8.6–12.4)
Platelets: 257 10*3/uL (ref 150–400)
RBC: 5.5 MIL/uL (ref 4.22–5.81)
RDW: 13.3 % (ref 11.5–15.5)
WBC: 6 10*3/uL (ref 4.0–10.5)

## 2014-04-23 ENCOUNTER — Encounter: Payer: Self-pay | Admitting: Family Medicine

## 2014-06-16 ENCOUNTER — Ambulatory Visit (INDEPENDENT_AMBULATORY_CARE_PROVIDER_SITE_OTHER): Payer: BLUE CROSS/BLUE SHIELD | Admitting: Pulmonary Disease

## 2014-06-16 ENCOUNTER — Encounter: Payer: Self-pay | Admitting: Pulmonary Disease

## 2014-06-16 ENCOUNTER — Encounter (INDEPENDENT_AMBULATORY_CARE_PROVIDER_SITE_OTHER): Payer: Self-pay

## 2014-06-16 VITALS — BP 142/80 | HR 80 | Temp 97.3°F | Ht 69.0 in | Wt 289.6 lb

## 2014-06-16 DIAGNOSIS — G4733 Obstructive sleep apnea (adult) (pediatric): Secondary | ICD-10-CM | POA: Diagnosis not present

## 2014-06-16 NOTE — Patient Instructions (Signed)
Will set up for a home sleep study, and will arrange followup once the results are available. Work on weight loss.

## 2014-06-16 NOTE — Progress Notes (Signed)
Subjective:    Patient ID: Mathew Greer, male    DOB: 1970-10-30, 44 y.o.   MRN: [id number]  HPI The patient is a 44 year old male who I've been asked to see for possible obstructive sleep apnea. He has been noted to have loud snoring, and his wife has heard an abnormal breathing pattern during sleep. His sleep has gotten so loud, that she has moved to a different bedroom. He denies choking arousals, and surprisingly does not have frequent awakenings. However, he is not rested in the mornings upon arising. He also notes significant inappropriate daytime sleepiness while at work, especially in the afternoons. He also gets sleepiness in the evenings with inactivity as well. He has occasional sleepiness driving longer distances. The patient's weight is up more than 15 pounds over the last 2 years, and his Epworth score today is 10.   Sleep Questionnaire What time do you typically go to bed?( Between what hours) 2300-2400 2300-2400 at 1608 on 06/16/14 by Tommie SamsMindy S Silva, CMA How long does it take you to fall asleep? 10-15 min 10-15 min at 1608 on 06/16/14 by Tommie SamsMindy S Silva, CMA How many times during the night do you wake up? 1 1 at 1608 on 06/16/14 by Tommie SamsMindy S Silva, CMA What time do you get out of bed to start your day? 0700 0700 at 1608 on 06/16/14 by Tommie SamsMindy S Silva, CMA Do you drive or operate heavy machinery in your occupation? No No at 1608 on 06/16/14 by Tommie SamsMindy S Silva, CMA How much has your weight changed (up or down) over the past two years? (In pounds) 10 lb (4.536 kg) 10 lb (4.536 kg) at 1608 on 06/16/14 by Tommie SamsMindy S Silva, CMA Have you ever had a sleep study before? No No at 1608 on 06/16/14 by Tommie SamsMindy S Silva, CMA Do you currently use CPAP? No No at 1608 on 06/16/14 by Tommie SamsMindy S Silva, CMA Do you wear oxygen at any time? No No at 1608 on 06/16/14 by Tommie SamsMindy S Silva, CMA   Review of Systems  Constitutional: Negative for fever and unexpected weight change.  HENT: Positive for  congestion, sneezing and sore throat. Negative for dental problem, ear pain, nosebleeds, postnasal drip, rhinorrhea, sinus pressure and trouble swallowing.   Eyes: Negative for redness and itching.  Respiratory: Negative for cough, chest tightness, shortness of breath and wheezing.   Cardiovascular: Positive for leg swelling. Negative for palpitations.  Gastrointestinal: Positive for abdominal pain. Negative for nausea and vomiting.  Genitourinary: Negative for dysuria.  Musculoskeletal: Negative for joint swelling.  Skin: Negative for rash.  Neurological: Negative for headaches.  Hematological: Does not bruise/bleed easily.  Psychiatric/Behavioral: Negative for dysphoric mood. The patient is not nervous/anxious.        Objective:   Physical Exam Constitutional:  Obese male, no acute distress  HENT:  Nares patent without discharge  Oropharynx without exudate, palate and uvula are mildly elongated  Eyes:  Perrla, eomi, no scleral icterus  Neck:  No JVD, no TMG  Cardiovascular:  Normal rate, regular rhythm, no rubs or gallops.  No murmurs        Intact distal pulses  Pulmonary :  Normal breath sounds, no stridor or respiratory distress   No rales, rhonchi, or wheezing  Abdominal:  Soft, nondistended, bowel sounds present.  No tenderness noted.   Musculoskeletal:  minimal lower extremity edema noted.  Lymph Nodes:  No cervical lymphadenopathy noted  Skin:  No cyanosis noted  Neurologic:  Alert, appropriate, moves all  4 extremities without obvious deficit.         Assessment & Plan:

## 2014-06-16 NOTE — Assessment & Plan Note (Signed)
The patient's history is classic for significant sleep disordered breathing, and I have had a long discussion with him about the pathophysiology of sleep apnea. He will need a sleep study for diagnosis, and I think he is an excellent candidate for home sleep testing. The patient is agreeable to this approach.

## 2014-07-29 DIAGNOSIS — G4730 Sleep apnea, unspecified: Secondary | ICD-10-CM

## 2014-07-29 DIAGNOSIS — G4733 Obstructive sleep apnea (adult) (pediatric): Secondary | ICD-10-CM

## 2014-08-04 ENCOUNTER — Telehealth: Payer: Self-pay | Admitting: Pulmonary Disease

## 2014-08-04 DIAGNOSIS — G4730 Sleep apnea, unspecified: Secondary | ICD-10-CM

## 2014-08-04 NOTE — Telephone Encounter (Signed)
Pt needs ov to review sleep study.  ?this week. If not, with another sleep md next week.

## 2014-08-04 NOTE — Telephone Encounter (Signed)
Called spoke with pt and is scheduled for Friday at 10

## 2014-08-05 ENCOUNTER — Other Ambulatory Visit: Payer: Self-pay | Admitting: *Deleted

## 2014-08-05 DIAGNOSIS — G4733 Obstructive sleep apnea (adult) (pediatric): Secondary | ICD-10-CM

## 2014-08-06 ENCOUNTER — Ambulatory Visit (INDEPENDENT_AMBULATORY_CARE_PROVIDER_SITE_OTHER): Payer: BLUE CROSS/BLUE SHIELD | Admitting: Pulmonary Disease

## 2014-08-06 ENCOUNTER — Encounter: Payer: Self-pay | Admitting: Pulmonary Disease

## 2014-08-06 VITALS — BP 124/76 | HR 74 | Temp 98.1°F | Ht 69.0 in | Wt 288.0 lb

## 2014-08-06 DIAGNOSIS — G4733 Obstructive sleep apnea (adult) (pediatric): Secondary | ICD-10-CM

## 2014-08-06 NOTE — Assessment & Plan Note (Signed)
The patient has moderate obstructive sleep apnea by his recent sleep study, and is significantly symptomatic both at night and during the day. I have recommended a trial of C Pap while working on weight loss, and the patient is agreeable.

## 2014-08-06 NOTE — Patient Instructions (Signed)
Will start on cpap for your moderate sleep apnea.  Please call if having issues with tolerance Work on weight loss followup with Dr. Craige Cotta in 8 weeks.

## 2014-08-06 NOTE — Progress Notes (Signed)
   Subjective:    Patient ID: Mathew Greer, male    DOB: [birth year], 44 y.o.   MRN: [id number]  HPI The patient comes in today for follow-up of his recent sleep study. He was found to have moderate obstructive sleep apnea, with an AHI of 20 events per hour. Have reviewed the study with him in detail, and answered all of his questions.   Review of Systems  Constitutional: Negative for fever and unexpected weight change.  HENT: Negative for congestion, dental problem, ear pain, nosebleeds, postnasal drip, rhinorrhea, sinus pressure, sneezing, sore throat and trouble swallowing.   Eyes: Negative for redness and itching.  Respiratory: Negative for cough, chest tightness, shortness of breath and wheezing.   Cardiovascular: Negative for palpitations and leg swelling.  Gastrointestinal: Negative for nausea and vomiting.  Genitourinary: Negative for dysuria.  Musculoskeletal: Negative for joint swelling.  Skin: Negative for rash.  Neurological: Negative for headaches.  Hematological: Does not bruise/bleed easily.  Psychiatric/Behavioral: Negative for dysphoric mood. The patient is not nervous/anxious.        Objective:   Physical Exam Obese male in no acute distress Nose without purulence or discharge noted Neck without lymphadenopathy or thyromegaly Lower extremities without significant edema, no cyanosis Alert and oriented, moves all 4 extremities.       Assessment & Plan:

## 2014-10-11 ENCOUNTER — Ambulatory Visit (INDEPENDENT_AMBULATORY_CARE_PROVIDER_SITE_OTHER): Payer: BLUE CROSS/BLUE SHIELD | Admitting: Family Medicine

## 2014-10-11 ENCOUNTER — Ambulatory Visit (INDEPENDENT_AMBULATORY_CARE_PROVIDER_SITE_OTHER): Payer: BLUE CROSS/BLUE SHIELD

## 2014-10-11 ENCOUNTER — Ambulatory Visit (INDEPENDENT_AMBULATORY_CARE_PROVIDER_SITE_OTHER): Payer: BLUE CROSS/BLUE SHIELD | Admitting: Pulmonary Disease

## 2014-10-11 ENCOUNTER — Encounter: Payer: Self-pay | Admitting: Pulmonary Disease

## 2014-10-11 VITALS — BP 118/76 | HR 74 | Temp 98.6°F | Resp 18 | Ht 69.75 in | Wt 289.2 lb

## 2014-10-11 VITALS — BP 136/90 | HR 65 | Ht 69.0 in | Wt 289.8 lb

## 2014-10-11 DIAGNOSIS — S90562A Insect bite (nonvenomous), left ankle, initial encounter: Secondary | ICD-10-CM

## 2014-10-11 DIAGNOSIS — Z87442 Personal history of urinary calculi: Secondary | ICD-10-CM | POA: Diagnosis not present

## 2014-10-11 DIAGNOSIS — W57XXXA Bitten or stung by nonvenomous insect and other nonvenomous arthropods, initial encounter: Secondary | ICD-10-CM | POA: Diagnosis not present

## 2014-10-11 DIAGNOSIS — M545 Low back pain: Secondary | ICD-10-CM | POA: Diagnosis not present

## 2014-10-11 DIAGNOSIS — G4733 Obstructive sleep apnea (adult) (pediatric): Secondary | ICD-10-CM | POA: Diagnosis not present

## 2014-10-11 LAB — POCT UA - MICROSCOPIC ONLY
Casts, Ur, LPF, POC: NEGATIVE
Crystals, Ur, HPF, POC: NEGATIVE
MUCUS UA: NEGATIVE
RBC, URINE, MICROSCOPIC: NEGATIVE
WBC, UR, HPF, POC: NEGATIVE
Yeast, UA: NEGATIVE

## 2014-10-11 LAB — POCT URINALYSIS DIPSTICK
Bilirubin, UA: NEGATIVE
Blood, UA: NEGATIVE
Glucose, UA: NEGATIVE
Ketones, UA: NEGATIVE
Leukocytes, UA: NEGATIVE
NITRITE UA: NEGATIVE
Protein, UA: NEGATIVE
Spec Grav, UA: >=1.03
Urobilinogen, UA: 0.2
pH, UA: 5

## 2014-10-11 NOTE — Progress Notes (Signed)
Subjective:  This chart was scribed for Mathew Staggers, MD by Mathew Greer, ED Scribe. This patient was seen in room 3 and the patient's care was started at 8:26 AM.  Patient ID: Mathew Greer, male    DOB: 1971-01-15, 44 y.o.   MRN: [id number]  HPI   Chief Complaint  Patient presents with  . Insect Bite    Spider bites around left ankle. Pt. has noticed some swelling in that area. x4 days   HPI Comments: Mathew Greer is a 44 y.o. male who presents to the [facility] complaining of multiple insect bites to left ankle noticed 3 days ago. Pt believes he was bit by a spider. States he was outside picking grapes on the coat a few days ago but did not see a spider bite him. He began to have itching to left ankle with raised small bumps 3 days ago. He developed some swelling and redness to bite sites the following day after. His last TDAP was in 2008.   Pt reports soreness to back over kidneys when he first wakes up in the morning prior to urination, for several months, worse in the past month. Pt only has pain in the morning that  is alleviated after urination. He also had some slight radiating back pain to right abdomen. He has treated pain with ibuprofen with relief to pain.  He has hx of kidney stones about 8 year but reports past symptoms were worse 8 years ago then current symptoms. He has also had some low back issues in the past. He denies fever, nausea, emeis dysuria, hematuria, and urinary frequency. He denies hx of prostate problems. He is not followed by urology.   Patient Active Problem List   Diagnosis Date Noted  . OSA (obstructive sleep apnea) 06/16/2014  . Severe obesity (BMI >= 40) 03/30/2013   Past Medical History  Diagnosis Date  . Overweight(278.02)   . Allergy   . Condylomata acuminata   . Asthmatic bronchitis   . Nephrolithiasis   . Gynecomastia   . Elevated LFTs     HEPATITIS PANEL NEGATIVE   Past Surgical History  Procedure Laterality Date  .  Hernia repair  2nd grade   No Known Allergies Prior to Admission medications   Medication Sig Start Date End Date Taking? Authorizing Provider  fexofenadine (ALLEGRA) 180 MG tablet Take 180 mg by mouth as needed.    Yes Historical Provider, MD   Social History   Social History  . Marital Status: Married    Spouse Name: Mathew Greer  . Number of Children: 2  . Years of Education: 18+   Occupational History  . Geographical Information  Science    Social History Main Topics  . Smoking status: Former Smoker -- 0.25 packs/day for 15 years    Types: Cigarettes    Quit date: 02/27/2007  . Smokeless tobacco: Never Used     Comment: when in [employer] smoked about 1 ppd. Smoked 15 yrs off and on   . Alcohol Use: 1.8 oz/week    3 Glasses of wine per week     Comment: 2 glasses per week  . Drug Use: No  . Sexual Activity:    Partners: Female    Copy: None   Other Topics Concern  . Not on file   Social History Narrative   Working on PhD.  Lives with his wife and their 2 daughters.   Review of Systems  Constitutional: Negative for  fever and chills.  Gastrointestinal: Positive for abdominal pain. Negative for nausea and vomiting.  Genitourinary: Negative for dysuria, urgency, frequency and hematuria.  Musculoskeletal: Positive for back pain.  Skin: Positive for color change and wound.      Objective:   Physical Exam  Constitutional: He is oriented to person, place, and time. He appears well-developed and well-nourished. No distress.  HENT:  Head: Normocephalic and atraumatic.  Eyes: Conjunctivae and EOM are normal.  Neck: Neck supple.  Cardiovascular: Normal rate.   Pulmonary/Chest: Effort normal.  Abdominal: Soft. He exhibits no distension. There is no tenderness.  Musculoskeletal: Normal range of motion.  Full ROM. Reproduction of pain with extension. Discomfort over left para spinal muscle lumbar spine. No CVA tenderness.   Neurological: He is alert and oriented  to person, place, and time.  Skin: Skin is warm and dry.  1cm erythematous, slightly darkened, lesions on left medial ankle with1 central small papule. On back of left ankle, 1.2 cm erythema slight hyperpigmented area with 2 small pin point excoriations. Picture on his phone with similar sized lesion but more red in appearance  Psychiatric: He has a normal mood and affect. His behavior is normal.  Nursing note and vitals reviewed.  Filed Vitals:   10/11/14 0822  BP: 118/76  Pulse: 74  Temp: 98.6 F (37 C)  TempSrc: Oral  Resp: 18  Height: 5' 9.75" (1.772 m)  Weight: 289 lb 3.2 oz (131.18 kg)  SpO2: 98%   Results for orders placed or performed in visit on 10/11/14  POCT urinalysis dipstick  Result Value Ref Range   Color, UA yellow    Clarity, UA clear    Glucose, UA neg    Bilirubin, UA neg    Ketones, UA neg    Spec Grav, UA >=1.030    Blood, UA neg    pH, UA 5.0    Protein, UA neg    Urobilinogen, UA 0.2    Nitrite, UA neg    Leukocytes, UA Negative Negative  POCT UA - Microscopic Only  Result Value Ref Range   WBC, Ur, HPF, POC neg    RBC, urine, microscopic neg    Bacteria, U Microscopic trace    Mucus, UA neg    Epithelial cells, urine per micros 0-2    Crystals, Ur, HPF, POC neg    Casts, Ur, LPF, POC neg    Yeast, UA neg    [facility] reading (PRIMARY) by Dr. Neva Seat.  Lumbar spine- 2 veiw. Slight posterior element degenerative changes. No acute findings.  Abdomen- 1 veiw. No nephrolith visualized.   Assessment & Plan:   Mathew Greer is a 44 y.o. male Insect bites  - Do not appear infected on exam today. Compared to the patient and his phone, they actually appear faded and improving. Can use over-the-counter cortisone cream as needed for itching or discomfort, and has mupirocin at home if you like to use this if any concern for infection. However if notices spreading erythema, or worsening, return for recheck.   Low back pain without sciatica, unspecified back  pain laterality - Plan: DG Abd 1 View, DG Lumbar Spine 2-3 Views, POCT urinalysis dipstick, POCT UA - Microscopic Only History of kidney stones - Plan: DG Abd 1 View, POCT urinalysis dipstick, POCT UA - Microscopic Only  -Suspected deconditioning/mechanical low back pain. Doubt kidney stone as cause, but as he has had stones in the past, recommended follow-up with urologist if he is not improving with home  exercises and treatment for musculoskeletal low back pain. RTC/ER precautions.  No orders of the defined types were placed in this encounter.   Patient Instructions  I suspect you have either spider or insect bites, but appear to be faded this morning. I do not see any infection at this point, but you can use the mupirocin you have at home a few times per day for next 5-7 days.  Over the counter cortisone cream for itching. Return to the clinic or go to the nearest emergency room if any of your symptoms worsen or new symptoms occur.  I suspect your back pain is likely due to muscles or discomfort of the back itself, less likely coming from your kidneys. Urine test was normal today, and no apparent kidney stones on x-ray. Try over-the-counter Tylenol or ibuprofen as needed, range of motion and stretches as discussed, and some of the exercises below as you can tolerate them. If you're not improving in next few weeks, I will be happy to refer you back to a urologist for evaluation. If you have an acute change or worsening in your symptoms, return here or emergency room.   Low Back Sprain with Rehab  A sprain is an injury in which a ligament is torn. The ligaments of the lower back are vulnerable to sprains. However, they are strong and require great force to be injured. These ligaments are important for stabilizing the spinal column. Sprains are classified into three categories. Grade 1 sprains cause pain, but the tendon is not lengthened. Grade 2 sprains include a lengthened ligament, due to the  ligament being stretched or partially ruptured. With grade 2 sprains there is still function, although the function may be decreased. Grade 3 sprains involve a complete tear of the tendon or muscle, and function is usually impaired. SYMPTOMS   Severe pain in the lower back.  Sometimes, a feeling of a "pop," "snap," or tear, at the time of injury.  Tenderness and sometimes swelling at the injury site.  Uncommonly, bruising (contusion) within 48 hours of injury.  Muscle spasms in the back. CAUSES  Low back sprains occur when a force is placed on the ligaments that is greater than they can handle. Common causes of injury include:  Performing a stressful act while off-balance.  Repetitive stressful activities that involve movement of the lower back.  Direct hit (trauma) to the lower back. RISK INCREASES WITH:  Contact sports (football, wrestling).  Collisions (major skiing accidents).  Sports that require throwing or lifting (baseball, weightlifting).  Sports involving twisting of the spine (gymnastics, diving, tennis, golf).  Poor strength and flexibility.  Inadequate protection.  Previous back injury or surgery (especially fusion). PREVENTION  Wear properly fitted and padded protective equipment.  Warm up and stretch properly before activity.  Allow for adequate recovery between workouts.  Maintain physical fitness:  Strength, flexibility, and endurance.  Cardiovascular fitness.  Maintain a healthy body weight. PROGNOSIS  If treated properly, low back sprains usually heal with non-surgical treatment. The length of time for healing depends on the severity of the injury.  RELATED COMPLICATIONS   Recurring symptoms, resulting in a chronic problem.  Chronic inflammation and pain in the low back.  Delayed healing or resolution of symptoms, especially if activity is resumed too soon.  Prolonged impairment.  Unstable or arthritic joints of the low back. TREATMENT   Treatment first involves the use of ice and medicine, to reduce pain and inflammation. The use of strengthening and stretching exercises may  help reduce pain with activity. These exercises may be performed at home or with a therapist. Severe injuries may require referral to a therapist for further evaluation and treatment, such as ultrasound. Your caregiver may advise that you wear a back brace or corset, to help reduce pain and discomfort. Often, prolonged bed rest results in greater harm then benefit. Corticosteroid injections may be recommended. However, these should be reserved for the most serious cases. It is important to avoid using your back when lifting objects. At night, sleep on your back on a firm mattress, with a pillow placed under your knees. If non-surgical treatment is unsuccessful, surgery may be needed.  MEDICATION   If pain medicine is needed, nonsteroidal anti-inflammatory medicines (aspirin and ibuprofen), or other minor pain relievers (acetaminophen), are often advised.  Do not take pain medicine for 7 days before surgery.  Prescription pain relievers may be given, if your caregiver thinks they are needed. Use only as directed and only as much as you need.  Ointments applied to the skin may be helpful.  Corticosteroid injections may be given by your caregiver. These injections should be reserved for the most serious cases, because they may only be given a certain number of times. HEAT AND COLD  Cold treatment (icing) should be applied for 10 to 15 minutes every 2 to 3 hours for inflammation and pain, and immediately after activity that aggravates your symptoms. Use ice packs or an ice massage.  Heat treatment may be used before performing stretching and strengthening activities prescribed by your caregiver, physical therapist, or athletic trainer. Use a heat pack or a warm water soak. SEEK MEDICAL CARE IF:   Symptoms get worse or do not improve in 2 to 4 weeks, despite  treatment.  You develop numbness or weakness in either leg.  You lose bowel or bladder function.  Any of the following occur after surgery: fever, increased pain, swelling, redness, drainage of fluids, or bleeding in the affected area.  New, unexplained symptoms develop. (Drugs used in treatment may produce side effects.) EXERCISES  RANGE OF MOTION (ROM) AND STRETCHING EXERCISES - Low Back Sprain Most people with lower back pain will find that their symptoms get worse with excessive bending forward (flexion) or arching at the lower back (extension). The exercises that will help resolve your symptoms will focus on the opposite motion.  Your physician, physical therapist or athletic trainer will help you determine which exercises will be most helpful to resolve your lower back pain. Do not complete any exercises without first consulting with your caregiver. Discontinue any exercises which make your symptoms worse, until you speak to your caregiver. If you have pain, numbness or tingling which travels down into your buttocks, leg or foot, the goal of the therapy is for these symptoms to move closer to your back and eventually resolve. Sometimes, these leg symptoms will get better, but your lower back pain may worsen. This is often an indication of progress in your rehabilitation. Be very alert to any changes in your symptoms and the activities in which you participated in the 24 hours prior to the change. Sharing this information with your caregiver will allow him or her to most efficiently treat your condition. These exercises may help you when beginning to rehabilitate your injury. Your symptoms may resolve with or without further involvement from your physician, physical therapist or athletic trainer. While completing these exercises, remember:   Restoring tissue flexibility helps normal motion to return to the joints. This allows  healthier, less painful movement and activity.  An effective stretch  should be held for at least 30 seconds.  A stretch should never be painful. You should only feel a gentle lengthening or release in the stretched tissue. FLEXION RANGE OF MOTION AND STRETCHING EXERCISES: STRETCH - Flexion, Single Knee to Chest   Lie on a firm bed or floor with both legs extended in front of you.  Keeping one leg in contact with the floor, bring your opposite knee to your chest. Hold your leg in place by either grabbing behind your thigh or at your knee.  Pull until you feel a gentle stretch in your low back. Hold __________ seconds.  Slowly release your grasp and repeat the exercise with the opposite side. Repeat __________ times. Complete this exercise __________ times per day.  STRETCH - Flexion, Double Knee to Chest  Lie on a firm bed or floor with both legs extended in front of you.  Keeping one leg in contact with the floor, bring your opposite knee to your chest.  Tense your stomach muscles to support your back and then lift your other knee to your chest. Hold your legs in place by either grabbing behind your thighs or at your knees.  Pull both knees toward your chest until you feel a gentle stretch in your low back. Hold __________ seconds.  Tense your stomach muscles and slowly return one leg at a time to the floor. Repeat __________ times. Complete this exercise __________ times per day.  STRETCH - Low Trunk Rotation  Lie on a firm bed or floor. Keeping your legs in front of you, bend your knees so they are both pointed toward the ceiling and your feet are flat on the floor.  Extend your arms out to the side. This will stabilize your upper body by keeping your shoulders in contact with the floor.  Gently and slowly drop both knees together to one side until you feel a gentle stretch in your low back. Hold for __________ seconds.  Tense your stomach muscles to support your lower back as you bring your knees back to the starting position. Repeat the exercise  to the other side. Repeat __________ times. Complete this exercise __________ times per day  EXTENSION RANGE OF MOTION AND FLEXIBILITY EXERCISES: STRETCH - Extension, Prone on Elbows   Lie on your stomach on the floor, a bed will be too soft. Place your palms about shoulder width apart and at the height of your head.  Place your elbows under your shoulders. If this is too painful, stack pillows under your chest.  Allow your body to relax so that your hips drop lower and make contact more completely with the floor.  Hold this position for __________ seconds.  Slowly return to lying flat on the floor. Repeat __________ times. Complete this exercise __________ times per day.  RANGE OF MOTION - Extension, Prone Press Ups  Lie on your stomach on the floor, a bed will be too soft. Place your palms about shoulder width apart and at the height of your head.  Keeping your back as relaxed as possible, slowly straighten your elbows while keeping your hips on the floor. You may adjust the placement of your hands to maximize your comfort. As you gain motion, your hands will come more underneath your shoulders.  Hold this position __________ seconds.  Slowly return to lying flat on the floor. Repeat __________ times. Complete this exercise __________ times per day.  RANGE OF MOTION- Quadruped,  Neutral Spine   Assume a hands and knees position on a firm surface. Keep your hands under your shoulders and your knees under your hips. You may place padding under your knees for comfort.  Drop your head and point your tailbone toward the ground below you. This will round out your lower back like an angry cat. Hold this position for __________ seconds.  Slowly lift your head and release your tail bone so that your back sags into a large arch, like an old horse.  Hold this position for __________ seconds.  Repeat this until you feel limber in your low back.  Now, find your "sweet spot." This will be the  most comfortable position somewhere between the two previous positions. This is your neutral spine. Once you have found this position, tense your stomach muscles to support your low back.  Hold this position for __________ seconds. Repeat __________ times. Complete this exercise __________ times per day.  STRENGTHENING EXERCISES - Low Back Sprain These exercises may help you when beginning to rehabilitate your injury. These exercises should be done near your "sweet spot." This is the neutral, low-back arch, somewhere between fully rounded and fully arched, that is your least painful position. When performed in this safe range of motion, these exercises can be used for people who have either a flexion or extension based injury. These exercises may resolve your symptoms with or without further involvement from your physician, physical therapist or athletic trainer. While completing these exercises, remember:   Muscles can gain both the endurance and the strength needed for everyday activities through controlled exercises.  Complete these exercises as instructed by your physician, physical therapist or athletic trainer. Increase the resistance and repetitions only as guided.  You may experience muscle soreness or fatigue, but the pain or discomfort you are trying to eliminate should never worsen during these exercises. If this pain does worsen, stop and make certain you are following the directions exactly. If the pain is still present after adjustments, discontinue the exercise until you can discuss the trouble with your caregiver. STRENGTHENING - Deep Abdominals, Pelvic Tilt   Lie on a firm bed or floor. Keeping your legs in front of you, bend your knees so they are both pointed toward the ceiling and your feet are flat on the floor.  Tense your lower abdominal muscles to press your low back into the floor. This motion will rotate your pelvis so that your tail bone is scooping upwards rather than  pointing at your feet or into the floor. With a gentle tension and even breathing, hold this position for __________ seconds. Repeat __________ times. Complete this exercise __________ times per day.  STRENGTHENING - Abdominals, Crunches   Lie on a firm bed or floor. Keeping your legs in front of you, bend your knees so they are both pointed toward the ceiling and your feet are flat on the floor. Cross your arms over your chest.  Slightly tip your chin down without bending your neck.  Tense your abdominals and slowly lift your trunk high enough to just clear your shoulder blades. Lifting higher can put excessive stress on the lower back and does not further strengthen your abdominal muscles.  Control your return to the starting position. Repeat __________ times. Complete this exercise __________ times per day.  STRENGTHENING - Quadruped, Opposite UE/LE Lift   Assume a hands and knees position on a firm surface. Keep your hands under your shoulders and your knees under your hips. You  may place padding under your knees for comfort.  Find your neutral spine and gently tense your abdominal muscles so that you can maintain this position. Your shoulders and hips should form a rectangle that is parallel with the floor and is not twisted.  Keeping your trunk steady, lift your right hand no higher than your shoulder and then your left leg no higher than your hip. Make sure you are not holding your breath. Hold this position for __________ seconds.  Continuing to keep your abdominal muscles tense and your back steady, slowly return to your starting position. Repeat with the opposite arm and leg. Repeat __________ times. Complete this exercise __________ times per day.  STRENGTHENING - Abdominals and Quadriceps, Straight Leg Raise   Lie on a firm bed or floor with both legs extended in front of you.  Keeping one leg in contact with the floor, bend the other knee so that your foot can rest flat on the  floor.  Find your neutral spine, and tense your abdominal muscles to maintain your spinal position throughout the exercise.  Slowly lift your straight leg off the floor about 6 inches for a count of 15, making sure to not hold your breath.  Still keeping your neutral spine, slowly lower your leg all the way to the floor. Repeat this exercise with each leg __________ times. Complete this exercise __________ times per day. POSTURE AND BODY MECHANICS CONSIDERATIONS - Low Back Sprain Keeping correct posture when sitting, standing or completing your activities will reduce the stress put on different body tissues, allowing injured tissues a chance to heal and limiting painful experiences. The following are general guidelines for improved posture. Your physician or physical therapist will provide you with any instructions specific to your needs. While reading these guidelines, remember:  The exercises prescribed by your provider will help you have the flexibility and strength to maintain correct postures.  The correct posture provides the best environment for your joints to work. All of your joints have less wear and tear when properly supported by a spine with good posture. This means you will experience a healthier, less painful body.  Correct posture must be practiced with all of your activities, especially prolonged sitting and standing. Correct posture is as important when doing repetitive low-stress activities (typing) as it is when doing a single heavy-load activity (lifting). RESTING POSITIONS Consider which positions are most painful for you when choosing a resting position. If you have pain with flexion-based activities (sitting, bending, stooping, squatting), choose a position that allows you to rest in a less flexed posture. You would want to avoid curling into a fetal position on your side. If your pain worsens with extension-based activities (prolonged standing, working overhead), avoid  resting in an extended position such as sleeping on your stomach. Most people will find more comfort when they rest with their spine in a more neutral position, neither too rounded nor too arched. Lying on a non-sagging bed on your side with a pillow between your knees, or on your back with a pillow under your knees will often provide some relief. Keep in mind, being in any one position for a prolonged period of time, no matter how correct your posture, can still lead to stiffness. PROPER SITTING POSTURE In order to minimize stress and discomfort on your spine, you must sit with correct posture. Sitting with good posture should be effortless for a healthy body. Returning to good posture is a gradual process. Many people can work toward  this most comfortably by using various supports until they have the flexibility and strength to maintain this posture on their own. When sitting with proper posture, your ears will fall over your shoulders and your shoulders will fall over your hips. You should use the back of the chair to support your upper back. Your lower back will be in a neutral position, just slightly arched. You may place a small pillow or folded towel at the base of your lower back for  support.  When working at a desk, create an environment that supports good, upright posture. Without extra support, muscles tire, which leads to excessive strain on joints and other tissues. Keep these recommendations in mind: CHAIR:  A chair should be able to slide under your desk when your back makes contact with the back of the chair. This allows you to work closely.  The chair's height should allow your eyes to be level with the upper part of your monitor and your hands to be slightly lower than your elbows. BODY POSITION  Your feet should make contact with the floor. If this is not possible, use a foot rest.  Keep your ears over your shoulders. This will reduce stress on your neck and low back. INCORRECT  SITTING POSTURES  If you are feeling tired and unable to assume a healthy sitting posture, do not slouch or slump. This puts excessive strain on your back tissues, causing more damage and pain. Healthier options include:  Using more support, like a lumbar pillow.  Switching tasks to something that requires you to be upright or walking.  Talking a brief walk.  Lying down to rest in a neutral-spine position. PROLONGED STANDING WHILE SLIGHTLY LEANING FORWARD  When completing a task that requires you to lean forward while standing in one place for a long time, place either foot up on a stationary 2-4 inch high object to help maintain the best posture. When both feet are on the ground, the lower back tends to lose its slight inward curve. If this curve flattens (or becomes too large), then the back and your other joints will experience too much stress, tire more quickly, and can cause pain. CORRECT STANDING POSTURES Proper standing posture should be assumed with all daily activities, even if they only take a few moments, like when brushing your teeth. As in sitting, your ears should fall over your shoulders and your shoulders should fall over your hips. You should keep a slight tension in your abdominal muscles to brace your spine. Your tailbone should point down to the ground, not behind your body, resulting in an over-extended swayback posture.  INCORRECT STANDING POSTURES  Common incorrect standing postures include a forward head, locked knees and/or an excessive swayback. WALKING Walk with an upright posture. Your ears, shoulders and hips should all line-up. PROLONGED ACTIVITY IN A FLEXED POSITION When completing a task that requires you to bend forward at your waist or lean over a low surface, try to find a way to stabilize 3 out of 4 of your limbs. You can place a hand or elbow on your thigh or rest a knee on the surface you are reaching across. This will provide you more stability, so that your  muscles do not tire as quickly. By keeping your knees relaxed, or slightly bent, you will also reduce stress across your lower back. CORRECT LIFTING TECHNIQUES DO :  Assume a wide stance. This will provide you more stability and the opportunity to get as close as  possible to the object which you are lifting.  Tense your abdominals to brace your spine. Bend at the knees and hips. Keeping your back locked in a neutral-spine position, lift using your leg muscles. Lift with your legs, keeping your back straight.  Test the weight of unknown objects before attempting to lift them.  Try to keep your elbows locked down at your sides in order get the best strength from your shoulders when carrying an object.  Always ask for help when lifting heavy or awkward objects. INCORRECT LIFTING TECHNIQUES DO NOT:   Lock your knees when lifting, even if it is a small object.  Bend and twist. Pivot at your feet or move your feet when needing to change directions.  Assume that you can safely pick up even a paperclip without proper posture. Document Released: 02/12/2005 Document Revised: 05/07/2011 Document Reviewed: 05/27/2008 [employer] Patient Information 2015 [place], [place]. This information is not intended to replace advice given to you by your health care provider. Make sure you discuss any questions you have with your health care provider.   Insect Bite Mosquitoes, flies, fleas, bedbugs, and many other insects can bite. Insect bites are different from insect stings. A sting is when venom is injected into the skin. Some insect bites can transmit infectious diseases. SYMPTOMS  Insect bites usually turn red, swell, and itch for 2 to 4 days. They often go away on their own. TREATMENT  Your caregiver may prescribe antibiotic medicines if a bacterial infection develops in the bite. HOME CARE INSTRUCTIONS  Do not scratch the bite area.  Keep the bite area clean and dry. Wash the bite area thoroughly with  soap and water.  Put ice or cool compresses on the bite area.  Put ice in a plastic bag.  Place a towel between your skin and the bag.  Leave the ice on for 20 minutes, 4 times a day for the first 2 to 3 days, or as directed.  You may apply a baking soda paste, cortisone cream, or calamine lotion to the bite area as directed by your caregiver. This can help reduce itching and swelling.  Only take over-the-counter or prescription medicines as directed by your caregiver.  If you are given antibiotics, take them as directed. Finish them even if you start to feel better. You may need a tetanus shot if:  You cannot remember when you had your last tetanus shot.  You have never had a tetanus shot.  The injury broke your skin. If you get a tetanus shot, your arm may swell, get red, and feel warm to the touch. This is common and not a problem. If you need a tetanus shot and you choose not to have one, there is a rare chance of getting tetanus. Sickness from tetanus can be serious. SEEK IMMEDIATE MEDICAL CARE IF:   You have increased pain, redness, or swelling in the bite area.  You see a red line on the skin coming from the bite.  You have a fever.  You have joint pain.  You have a headache or neck pain.  You have unusual weakness.  You have a rash.  You have chest pain or shortness of breath.  You have abdominal pain, nausea, or vomiting.  You feel unusually tired or sleepy. MAKE SURE YOU:   Understand these instructions.  Will watch your condition.  Will get help right away if you are not doing well or get worse. Document Released: 03/22/2004 Document Revised: 05/07/2011 Document Reviewed:  09/13/2010 [employer] Patient Information 2015 [place], [place]. This information is not intended to replace advice given to you by your health care provider. Make sure you discuss any questions you have with your health care provider.      I personally performed the services  described in this documentation, which was scribed in my presence. The recorded information has been reviewed and considered, and addended by me as needed.

## 2014-10-11 NOTE — Patient Instructions (Addendum)
I suspect you have either spider or insect bites, but appear to be faded this morning. I do not see any infection at this point, but you can use the mupirocin you have at home a few times per day for next 5-7 days.  Over the counter cortisone cream for itching. Return to the clinic or go to the nearest emergency room if any of your symptoms worsen or new symptoms occur.  I suspect your back pain is likely due to muscles or discomfort of the back itself, less likely coming from your kidneys. Urine test was normal today, and no apparent kidney stones on x-ray. Try over-the-counter Tylenol or ibuprofen as needed, range of motion and stretches as discussed, and some of the exercises below as you can tolerate them. If you're not improving in next few weeks, I will be happy to refer you back to a urologist for evaluation. If you have an acute change or worsening in your symptoms, return here or emergency room.   Low Back Sprain with Rehab  A sprain is an injury in which a ligament is torn. The ligaments of the lower back are vulnerable to sprains. However, they are strong and require great force to be injured. These ligaments are important for stabilizing the spinal column. Sprains are classified into three categories. Grade 1 sprains cause pain, but the tendon is not lengthened. Grade 2 sprains include a lengthened ligament, due to the ligament being stretched or partially ruptured. With grade 2 sprains there is still function, although the function may be decreased. Grade 3 sprains involve a complete tear of the tendon or muscle, and function is usually impaired. SYMPTOMS   Severe pain in the lower back.  Sometimes, a feeling of a "pop," "snap," or tear, at the time of injury.  Tenderness and sometimes swelling at the injury site.  Uncommonly, bruising (contusion) within 48 hours of injury.  Muscle spasms in the back. CAUSES  Low back sprains occur when a force is placed on the ligaments that is  greater than they can handle. Common causes of injury include:  Performing a stressful act while off-balance.  Repetitive stressful activities that involve movement of the lower back.  Direct hit (trauma) to the lower back. RISK INCREASES WITH:  Contact sports (football, wrestling).  Collisions (major skiing accidents).  Sports that require throwing or lifting (baseball, weightlifting).  Sports involving twisting of the spine (gymnastics, diving, tennis, golf).  Poor strength and flexibility.  Inadequate protection.  Previous back injury or surgery (especially fusion). PREVENTION  Wear properly fitted and padded protective equipment.  Warm up and stretch properly before activity.  Allow for adequate recovery between workouts.  Maintain physical fitness:  Strength, flexibility, and endurance.  Cardiovascular fitness.  Maintain a healthy body weight. PROGNOSIS  If treated properly, low back sprains usually heal with non-surgical treatment. The length of time for healing depends on the severity of the injury.  RELATED COMPLICATIONS   Recurring symptoms, resulting in a chronic problem.  Chronic inflammation and pain in the low back.  Delayed healing or resolution of symptoms, especially if activity is resumed too soon.  Prolonged impairment.  Unstable or arthritic joints of the low back. TREATMENT  Treatment first involves the use of ice and medicine, to reduce pain and inflammation. The use of strengthening and stretching exercises may help reduce pain with activity. These exercises may be performed at home or with a therapist. Severe injuries may require referral to a therapist for further evaluation and  treatment, such as ultrasound. Your caregiver may advise that you wear a back brace or corset, to help reduce pain and discomfort. Often, prolonged bed rest results in greater harm then benefit. Corticosteroid injections may be recommended. However, these should be  reserved for the most serious cases. It is important to avoid using your back when lifting objects. At night, sleep on your back on a firm mattress, with a pillow placed under your knees. If non-surgical treatment is unsuccessful, surgery may be needed.  MEDICATION   If pain medicine is needed, nonsteroidal anti-inflammatory medicines (aspirin and ibuprofen), or other minor pain relievers (acetaminophen), are often advised.  Do not take pain medicine for 7 days before surgery.  Prescription pain relievers may be given, if your caregiver thinks they are needed. Use only as directed and only as much as you need.  Ointments applied to the skin may be helpful.  Corticosteroid injections may be given by your caregiver. These injections should be reserved for the most serious cases, because they may only be given a certain number of times. HEAT AND COLD  Cold treatment (icing) should be applied for 10 to 15 minutes every 2 to 3 hours for inflammation and pain, and immediately after activity that aggravates your symptoms. Use ice packs or an ice massage.  Heat treatment may be used before performing stretching and strengthening activities prescribed by your caregiver, physical therapist, or athletic trainer. Use a heat pack or a warm water soak. SEEK MEDICAL CARE IF:   Symptoms get worse or do not improve in 2 to 4 weeks, despite treatment.  You develop numbness or weakness in either leg.  You lose bowel or bladder function.  Any of the following occur after surgery: fever, increased pain, swelling, redness, drainage of fluids, or bleeding in the affected area.  New, unexplained symptoms develop. (Drugs used in treatment may produce side effects.) EXERCISES  RANGE OF MOTION (ROM) AND STRETCHING EXERCISES - Low Back Sprain Most people with lower back pain will find that their symptoms get worse with excessive bending forward (flexion) or arching at the lower back (extension). The exercises that  will help resolve your symptoms will focus on the opposite motion.  Your physician, physical therapist or athletic trainer will help you determine which exercises will be most helpful to resolve your lower back pain. Do not complete any exercises without first consulting with your caregiver. Discontinue any exercises which make your symptoms worse, until you speak to your caregiver. If you have pain, numbness or tingling which travels down into your buttocks, leg or foot, the goal of the therapy is for these symptoms to move closer to your back and eventually resolve. Sometimes, these leg symptoms will get better, but your lower back pain may worsen. This is often an indication of progress in your rehabilitation. Be very alert to any changes in your symptoms and the activities in which you participated in the 24 hours prior to the change. Sharing this information with your caregiver will allow him or her to most efficiently treat your condition. These exercises may help you when beginning to rehabilitate your injury. Your symptoms may resolve with or without further involvement from your physician, physical therapist or athletic trainer. While completing these exercises, remember:   Restoring tissue flexibility helps normal motion to return to the joints. This allows healthier, less painful movement and activity.  An effective stretch should be held for at least 30 seconds.  A stretch should never be painful. You should  only feel a gentle lengthening or release in the stretched tissue. FLEXION RANGE OF MOTION AND STRETCHING EXERCISES: STRETCH - Flexion, Single Knee to Chest   Lie on a firm bed or floor with both legs extended in front of you.  Keeping one leg in contact with the floor, bring your opposite knee to your chest. Hold your leg in place by either grabbing behind your thigh or at your knee.  Pull until you feel a gentle stretch in your low back. Hold __________ seconds.  Slowly release  your grasp and repeat the exercise with the opposite side. Repeat __________ times. Complete this exercise __________ times per day.  STRETCH - Flexion, Double Knee to Chest  Lie on a firm bed or floor with both legs extended in front of you.  Keeping one leg in contact with the floor, bring your opposite knee to your chest.  Tense your stomach muscles to support your back and then lift your other knee to your chest. Hold your legs in place by either grabbing behind your thighs or at your knees.  Pull both knees toward your chest until you feel a gentle stretch in your low back. Hold __________ seconds.  Tense your stomach muscles and slowly return one leg at a time to the floor. Repeat __________ times. Complete this exercise __________ times per day.  STRETCH - Low Trunk Rotation  Lie on a firm bed or floor. Keeping your legs in front of you, bend your knees so they are both pointed toward the ceiling and your feet are flat on the floor.  Extend your arms out to the side. This will stabilize your upper body by keeping your shoulders in contact with the floor.  Gently and slowly drop both knees together to one side until you feel a gentle stretch in your low back. Hold for __________ seconds.  Tense your stomach muscles to support your lower back as you bring your knees back to the starting position. Repeat the exercise to the other side. Repeat __________ times. Complete this exercise __________ times per day  EXTENSION RANGE OF MOTION AND FLEXIBILITY EXERCISES: STRETCH - Extension, Prone on Elbows   Lie on your stomach on the floor, a bed will be too soft. Place your palms about shoulder width apart and at the height of your head.  Place your elbows under your shoulders. If this is too painful, stack pillows under your chest.  Allow your body to relax so that your hips drop lower and make contact more completely with the floor.  Hold this position for __________ seconds.  Slowly  return to lying flat on the floor. Repeat __________ times. Complete this exercise __________ times per day.  RANGE OF MOTION - Extension, Prone Press Ups  Lie on your stomach on the floor, a bed will be too soft. Place your palms about shoulder width apart and at the height of your head.  Keeping your back as relaxed as possible, slowly straighten your elbows while keeping your hips on the floor. You may adjust the placement of your hands to maximize your comfort. As you gain motion, your hands will come more underneath your shoulders.  Hold this position __________ seconds.  Slowly return to lying flat on the floor. Repeat __________ times. Complete this exercise __________ times per day.  RANGE OF MOTION- Quadruped, Neutral Spine   Assume a hands and knees position on a firm surface. Keep your hands under your shoulders and your knees under your hips. You  may place padding under your knees for comfort.  Drop your head and point your tailbone toward the ground below you. This will round out your lower back like an angry cat. Hold this position for __________ seconds.  Slowly lift your head and release your tail bone so that your back sags into a large arch, like an old horse.  Hold this position for __________ seconds.  Repeat this until you feel limber in your low back.  Now, find your "sweet spot." This will be the most comfortable position somewhere between the two previous positions. This is your neutral spine. Once you have found this position, tense your stomach muscles to support your low back.  Hold this position for __________ seconds. Repeat __________ times. Complete this exercise __________ times per day.  STRENGTHENING EXERCISES - Low Back Sprain These exercises may help you when beginning to rehabilitate your injury. These exercises should be done near your "sweet spot." This is the neutral, low-back arch, somewhere between fully rounded and fully arched, that is your  least painful position. When performed in this safe range of motion, these exercises can be used for people who have either a flexion or extension based injury. These exercises may resolve your symptoms with or without further involvement from your physician, physical therapist or athletic trainer. While completing these exercises, remember:   Muscles can gain both the endurance and the strength needed for everyday activities through controlled exercises.  Complete these exercises as instructed by your physician, physical therapist or athletic trainer. Increase the resistance and repetitions only as guided.  You may experience muscle soreness or fatigue, but the pain or discomfort you are trying to eliminate should never worsen during these exercises. If this pain does worsen, stop and make certain you are following the directions exactly. If the pain is still present after adjustments, discontinue the exercise until you can discuss the trouble with your caregiver. STRENGTHENING - Deep Abdominals, Pelvic Tilt   Lie on a firm bed or floor. Keeping your legs in front of you, bend your knees so they are both pointed toward the ceiling and your feet are flat on the floor.  Tense your lower abdominal muscles to press your low back into the floor. This motion will rotate your pelvis so that your tail bone is scooping upwards rather than pointing at your feet or into the floor. With a gentle tension and even breathing, hold this position for __________ seconds. Repeat __________ times. Complete this exercise __________ times per day.  STRENGTHENING - Abdominals, Crunches   Lie on a firm bed or floor. Keeping your legs in front of you, bend your knees so they are both pointed toward the ceiling and your feet are flat on the floor. Cross your arms over your chest.  Slightly tip your chin down without bending your neck.  Tense your abdominals and slowly lift your trunk high enough to just clear your  shoulder blades. Lifting higher can put excessive stress on the lower back and does not further strengthen your abdominal muscles.  Control your return to the starting position. Repeat __________ times. Complete this exercise __________ times per day.  STRENGTHENING - Quadruped, Opposite UE/LE Lift   Assume a hands and knees position on a firm surface. Keep your hands under your shoulders and your knees under your hips. You may place padding under your knees for comfort.  Find your neutral spine and gently tense your abdominal muscles so that you can maintain this position. Your  shoulders and hips should form a rectangle that is parallel with the floor and is not twisted.  Keeping your trunk steady, lift your right hand no higher than your shoulder and then your left leg no higher than your hip. Make sure you are not holding your breath. Hold this position for __________ seconds.  Continuing to keep your abdominal muscles tense and your back steady, slowly return to your starting position. Repeat with the opposite arm and leg. Repeat __________ times. Complete this exercise __________ times per day.  STRENGTHENING - Abdominals and Quadriceps, Straight Leg Raise   Lie on a firm bed or floor with both legs extended in front of you.  Keeping one leg in contact with the floor, bend the other knee so that your foot can rest flat on the floor.  Find your neutral spine, and tense your abdominal muscles to maintain your spinal position throughout the exercise.  Slowly lift your straight leg off the floor about 6 inches for a count of 15, making sure to not hold your breath.  Still keeping your neutral spine, slowly lower your leg all the way to the floor. Repeat this exercise with each leg __________ times. Complete this exercise __________ times per day. POSTURE AND BODY MECHANICS CONSIDERATIONS - Low Back Sprain Keeping correct posture when sitting, standing or completing your activities will  reduce the stress put on different body tissues, allowing injured tissues a chance to heal and limiting painful experiences. The following are general guidelines for improved posture. Your physician or physical therapist will provide you with any instructions specific to your needs. While reading these guidelines, remember:  The exercises prescribed by your provider will help you have the flexibility and strength to maintain correct postures.  The correct posture provides the best environment for your joints to work. All of your joints have less wear and tear when properly supported by a spine with good posture. This means you will experience a healthier, less painful body.  Correct posture must be practiced with all of your activities, especially prolonged sitting and standing. Correct posture is as important when doing repetitive low-stress activities (typing) as it is when doing a single heavy-load activity (lifting). RESTING POSITIONS Consider which positions are most painful for you when choosing a resting position. If you have pain with flexion-based activities (sitting, bending, stooping, squatting), choose a position that allows you to rest in a less flexed posture. You would want to avoid curling into a fetal position on your side. If your pain worsens with extension-based activities (prolonged standing, working overhead), avoid resting in an extended position such as sleeping on your stomach. Most people will find more comfort when they rest with their spine in a more neutral position, neither too rounded nor too arched. Lying on a non-sagging bed on your side with a pillow between your knees, or on your back with a pillow under your knees will often provide some relief. Keep in mind, being in any one position for a prolonged period of time, no matter how correct your posture, can still lead to stiffness. PROPER SITTING POSTURE In order to minimize stress and discomfort on your spine, you must  sit with correct posture. Sitting with good posture should be effortless for a healthy body. Returning to good posture is a gradual process. Many people can work toward this most comfortably by using various supports until they have the flexibility and strength to maintain this posture on their own. When sitting with proper posture, your  ears will fall over your shoulders and your shoulders will fall over your hips. You should use the back of the chair to support your upper back. Your lower back will be in a neutral position, just slightly arched. You may place a small pillow or folded towel at the base of your lower back for  support.  When working at a desk, create an environment that supports good, upright posture. Without extra support, muscles tire, which leads to excessive strain on joints and other tissues. Keep these recommendations in mind: CHAIR:  A chair should be able to slide under your desk when your back makes contact with the back of the chair. This allows you to work closely.  The chair's height should allow your eyes to be level with the upper part of your monitor and your hands to be slightly lower than your elbows. BODY POSITION  Your feet should make contact with the floor. If this is not possible, use a foot rest.  Keep your ears over your shoulders. This will reduce stress on your neck and low back. INCORRECT SITTING POSTURES  If you are feeling tired and unable to assume a healthy sitting posture, do not slouch or slump. This puts excessive strain on your back tissues, causing more damage and pain. Healthier options include:  Using more support, like a lumbar pillow.  Switching tasks to something that requires you to be upright or walking.  Talking a brief walk.  Lying down to rest in a neutral-spine position. PROLONGED STANDING WHILE SLIGHTLY LEANING FORWARD  When completing a task that requires you to lean forward while standing in one place for a long time, place  either foot up on a stationary 2-4 inch high object to help maintain the best posture. When both feet are on the ground, the lower back tends to lose its slight inward curve. If this curve flattens (or becomes too large), then the back and your other joints will experience too much stress, tire more quickly, and can cause pain. CORRECT STANDING POSTURES Proper standing posture should be assumed with all daily activities, even if they only take a few moments, like when brushing your teeth. As in sitting, your ears should fall over your shoulders and your shoulders should fall over your hips. You should keep a slight tension in your abdominal muscles to brace your spine. Your tailbone should point down to the ground, not behind your body, resulting in an over-extended swayback posture.  INCORRECT STANDING POSTURES  Common incorrect standing postures include a forward head, locked knees and/or an excessive swayback. WALKING Walk with an upright posture. Your ears, shoulders and hips should all line-up. PROLONGED ACTIVITY IN A FLEXED POSITION When completing a task that requires you to bend forward at your waist or lean over a low surface, try to find a way to stabilize 3 out of 4 of your limbs. You can place a hand or elbow on your thigh or rest a knee on the surface you are reaching across. This will provide you more stability, so that your muscles do not tire as quickly. By keeping your knees relaxed, or slightly bent, you will also reduce stress across your lower back. CORRECT LIFTING TECHNIQUES DO :  Assume a wide stance. This will provide you more stability and the opportunity to get as close as possible to the object which you are lifting.  Tense your abdominals to brace your spine. Bend at the knees and hips. Keeping your back locked in a  neutral-spine position, lift using your leg muscles. Lift with your legs, keeping your back straight.  Test the weight of unknown objects before attempting to  lift them.  Try to keep your elbows locked down at your sides in order get the best strength from your shoulders when carrying an object.  Always ask for help when lifting heavy or awkward objects. INCORRECT LIFTING TECHNIQUES DO NOT:   Lock your knees when lifting, even if it is a small object.  Bend and twist. Pivot at your feet or move your feet when needing to change directions.  Assume that you can safely pick up even a paperclip without proper posture. Document Released: 02/12/2005 Document Revised: 05/07/2011 Document Reviewed: 05/27/2008 [facility] Patient Information 2015 [place], [place]. This information is not intended to replace advice given to you by your health care provider. Make sure you discuss any questions you have with your health care provider.   Insect Bite Mosquitoes, flies, fleas, bedbugs, and many other insects can bite. Insect bites are different from insect stings. A sting is when venom is injected into the skin. Some insect bites can transmit infectious diseases. SYMPTOMS  Insect bites usually turn red, swell, and itch for 2 to 4 days. They often go away on their own. TREATMENT  Your caregiver may prescribe antibiotic medicines if a bacterial infection develops in the bite. HOME CARE INSTRUCTIONS  Do not scratch the bite area.  Keep the bite area clean and dry. Wash the bite area thoroughly with soap and water.  Put ice or cool compresses on the bite area.  Put ice in a plastic bag.  Place a towel between your skin and the bag.  Leave the ice on for 20 minutes, 4 times a day for the first 2 to 3 days, or as directed.  You may apply a baking soda paste, cortisone cream, or calamine lotion to the bite area as directed by your caregiver. This can help reduce itching and swelling.  Only take over-the-counter or prescription medicines as directed by your caregiver.  If you are given antibiotics, take them as directed. Finish them even if you start to feel  better. You may need a tetanus shot if:  You cannot remember when you had your last tetanus shot.  You have never had a tetanus shot.  The injury broke your skin. If you get a tetanus shot, your arm may swell, get red, and feel warm to the touch. This is common and not a problem. If you need a tetanus shot and you choose not to have one, there is a rare chance of getting tetanus. Sickness from tetanus can be serious. SEEK IMMEDIATE MEDICAL CARE IF:   You have increased pain, redness, or swelling in the bite area.  You see a red line on the skin coming from the bite.  You have a fever.  You have joint pain.  You have a headache or neck pain.  You have unusual weakness.  You have a rash.  You have chest pain or shortness of breath.  You have abdominal pain, nausea, or vomiting.  You feel unusually tired or sleepy. MAKE SURE YOU:   Understand these instructions.  Will watch your condition.  Will get help right away if you are not doing well or get worse. Document Released: 03/22/2004 Document Revised: 05/07/2011 Document Reviewed: 09/13/2010 [facility] Patient Information 2015 [place], [place]. This information is not intended to replace advice given to you by your health care provider. Make sure you discuss  any questions you have with your health care provider.   

## 2014-10-11 NOTE — Patient Instructions (Signed)
Follow up in 1 year.

## 2014-10-11 NOTE — Progress Notes (Signed)
Chief Complaint  Patient presents with  . Follow-up    Wears CPAP nightly. Denies any issues with mask fit. Pressure seems to be a little too high. Reports some mask leaking    History of Present Illness: Mathew Greer is a 44 y.o. male with OSA.  He was previously seen by Dr. Shelle Iron.  He was started on CPAP after having sleep study in June 2016 which showed moderate sleep apnea.  He has been sleeping better with CPAP.  He has full face mask >> occasional leak with he rolls on side.  Not snoring anymore.  He only gets about 6 hrs sleep per night >> has full time job and getting Phd in geography at [employer].  He is starting to cook meals for the week at home on weekends, and ride his bike to school.  TESTS: HST 07/29/14 >> AHI 20 Auto CPAP 09/11/14 to 10/10/14 >> used on 27 of 30 nights with average 5 hrs and 50 min.  Average AHI is 1.8 with median CPAP 7 cm H2O and 95 th percentile CPAP 9 cm H20.   Past medical hx, Past surgical hx, Medications, Allergies, Family hx, Social hx all reviewed.   Physical Exam: BP 136/90 mmHg  Pulse 65  Ht  (1.753 m)  Wt 289 lb 12.8 oz (131.452 kg)  BMI 42.78 kg/m2  SpO2 97%  General - No distress ENT - No sinus tenderness, no oral exudate, no LAN, MP 3 Cardiac - s1s2 regular, no murmur Chest - No wheeze/rales/dullness Back - No focal tenderness Abd - Soft, non-tender Ext - No edema Neuro - Normal strength Skin - No rashes Psych - normal mood, and behavior   Assessment/Plan:  Obstructive sleep apnea. He is compliant with CPAP and reports benefit from therapy. Plan: - continue auto CPAP - he can look for CPAP pillows  Obesity. Plan: - reviewed options to assist with weight loss   Coralyn Helling, MD [facility] Pager:  [phone]

## 2014-11-30 ENCOUNTER — Encounter: Payer: Self-pay | Admitting: Emergency Medicine

## 2015-01-06 ENCOUNTER — Ambulatory Visit (INDEPENDENT_AMBULATORY_CARE_PROVIDER_SITE_OTHER): Payer: BLUE CROSS/BLUE SHIELD | Admitting: Family Medicine

## 2015-01-06 ENCOUNTER — Ambulatory Visit (INDEPENDENT_AMBULATORY_CARE_PROVIDER_SITE_OTHER): Payer: BLUE CROSS/BLUE SHIELD

## 2015-01-06 VITALS — BP 134/78 | HR 84 | Temp 98.3°F | Resp 18 | Ht 69.0 in | Wt 282.0 lb

## 2015-01-06 DIAGNOSIS — Z87442 Personal history of urinary calculi: Secondary | ICD-10-CM

## 2015-01-06 DIAGNOSIS — J22 Unspecified acute lower respiratory infection: Secondary | ICD-10-CM

## 2015-01-06 DIAGNOSIS — R350 Frequency of micturition: Secondary | ICD-10-CM | POA: Diagnosis not present

## 2015-01-06 DIAGNOSIS — R109 Unspecified abdominal pain: Secondary | ICD-10-CM

## 2015-01-06 DIAGNOSIS — J988 Other specified respiratory disorders: Secondary | ICD-10-CM | POA: Diagnosis not present

## 2015-01-06 LAB — POCT CBC
Granulocyte percent: 59.3 % (ref 37–80)
HCT, POC: 45.8 % (ref 43.5–53.7)
Hemoglobin: 16 g/dL (ref 14.1–18.1)
Lymph, poc: 2.5 (ref 0.6–3.4)
MCH, POC: 29.9 pg (ref 27–31.2)
MCHC: 35 g/dL (ref 31.8–35.4)
MCV: 85.4 fL (ref 80–97)
MID (cbc): 0.3 (ref 0–0.9)
MPV: 7.7 fL (ref 0–99.8)
POC Granulocyte: 4.1 (ref 2–6.9)
POC LYMPH PERCENT: 35.8 %L (ref 10–50)
POC MID %: 4.9 %M (ref 0–12)
Platelet Count, POC: 181 10*3/uL (ref 142–424)
RBC: 5.37 M/uL (ref 4.69–6.13)
RDW, POC: 13.3 %
WBC: 6.9 10*3/uL (ref 4.6–10.2)

## 2015-01-06 LAB — POCT URINALYSIS DIP (MANUAL ENTRY)
BILIRUBIN UA: NEGATIVE
GLUCOSE UA: NEGATIVE
Ketones, POC UA: NEGATIVE
Leukocytes, UA: NEGATIVE
NITRITE UA: NEGATIVE
Protein Ur, POC: NEGATIVE
Spec Grav, UA: 1.01
Urobilinogen, UA: 0.2
pH, UA: 5

## 2015-01-06 LAB — POC MICROSCOPIC URINALYSIS (UMFC): MUCUS RE: ABSENT

## 2015-01-06 MED ORDER — TAMSULOSIN HCL 0.4 MG PO CAPS: 0.4000 mg | ORAL_CAPSULE | Freq: Every day | ORAL | Status: DC

## 2015-01-06 MED ORDER — AZITHROMYCIN 250 MG PO TABS: ORAL_TABLET | ORAL | Status: DC

## 2015-01-06 NOTE — Progress Notes (Signed)
 Chief Complaint:  Chief Complaint  Patient presents with  . Back Pain    today, left sided, hx of kidney stone, pt states that it feels similar  . Cough  . Nasal Congestion  . Urinary Frequency    HPI: Mathew Greer is a 44 y.o. male who reports to [facility] today complaining of 1 day history of let lower back pain this afternoon, feels like his prior kidney stone 6/10 pain. He is also having a URI and is coughing a lot. NKI. He has pain with urination. He has pain when he moves. He is finally getting over a cold. He He has back pain, Eating and drinking normally, he started having this after normal lunch. No fatty foods.  Normal BMs, passing flatus   In 2006:  IMPRESSION:  Right hydronephrosis and a distal right ureteral stone measuring 3 mm.  PELVIS CT WITHOUT CONTRAST:  Technique: Multidetector CT imaging of the pelvis was performed following the standard protocol without IV contrast.  Findings: Urinary bladder negative. Pelvic bowel loops are negative. No free fluid.  No lymphadenopathy.  IMPRESSION:  No acute pelvic CT findings.  Past Medical History  Diagnosis Date  . Overweight(278.02)   . Allergy   . Condylomata acuminata   . Asthmatic bronchitis   . Nephrolithiasis   . Gynecomastia   . Elevated LFTs     HEPATITIS PANEL NEGATIVE   Past Surgical History  Procedure Laterality Date  . Hernia repair  2nd grade   Social History   Social History  . Marital Status: Married    Spouse Name: Maralyn Sago  . Number of Children: 2  . Years of Education: 18+   Occupational History  . Geographical Information  Science    Social History Main Topics  . Smoking status: Former Smoker -- 0.25 packs/day for 15 years    Types: Cigarettes    Quit date: 02/27/2007  . Smokeless tobacco: Never Used     Comment: when in [employer] smoked about 1 ppd. Smoked 15 yrs off and on   . Alcohol Use: 1.8 oz/week    3 Glasses of wine per week     Comment: 2 glasses per week  . Drug  Use: No  . Sexual Activity:    Partners: Female    Copy: None   Other Topics Concern  . None   Social History Narrative   Working on PhD.  Lives with his wife and their 2 daughters.   Family History  Problem Relation Age of Onset  . Diabetes Father   . Depression Father   . Mental illness Father   . Stroke Maternal Grandmother   . Cancer Maternal Grandfather     lung  . Alzheimer's disease Paternal Grandmother   . Cancer Paternal Grandfather     lung   No Known Allergies Prior to Admission medications   Medication Sig Start Date End Date Taking? Authorizing Provider  fexofenadine (ALLEGRA) 180 MG tablet Take 180 mg by mouth as needed.    Yes Historical Provider, MD  ibuprofen (ADVIL,MOTRIN) 200 MG tablet Take 200 mg by mouth every 6 (six) hours as needed.   Yes Historical Provider, MD     ROS: The patient denies fevers, chills, night sweats, unintentional weight loss, chest pain, palpitations, wheezing, dyspnea on exertion, nausea, vomiting, abdominal pain, dysuria, hematuria, melena, numbness, weakness, or tingling.   All other systems have been reviewed and were otherwise negative with the exception of those mentioned  in the HPI and as above.    PHYSICAL EXAM: Filed Vitals:   01/06/15 1837  BP: 134/78  Pulse: 84  Temp: 98.3 F (36.8 C)  Resp: 18   Body mass index is 41.63 kg/(m^2).   General: Alert, minimal acute distress, obese  HEENT:  Normocephalic, atraumatic, oropharynx patent. EOMI, PERRLA Cardiovascular:  Regular rate and rhythm, no rubs murmurs or gallops.  No Carotid bruits, radial pulse intact. No pedal edema.  Respiratory: Clear to auscultation bilaterally.  No wheezes, rales, or rhonchi.  No cyanosis, no use of accessory musculature Abdominal: No organomegaly, abdomen is soft and non-tender, positive bowel sounds. No masses. Skin: No rashes. Neurologic: Facial musculature symmetric. Psychiatric: Patient acts appropriately  throughout our interaction. Lymphatic: No cervical or submandibular lymphadenopathy Musculoskeletal: Gait intact. No edema, tenderness   LABS: Results for orders placed or performed in visit on 01/06/15  POCT Microscopic Urinalysis ([facility])  Result Value Ref Range   WBC,UR,HPF,POC None None WBC/hpf   RBC,UR,HPF,POC Moderate (A) None RBC/hpf   Bacteria Few (A) None, Too numerous to count   Mucus Absent Absent   Epithelial Cells, UR Per Microscopy Few (A) None, Too numerous to count cells/hpf  POCT urinalysis dipstick  Result Value Ref Range   Color, UA yellow yellow   Clarity, UA clear clear   Glucose, UA negative negative   Bilirubin, UA negative negative   Ketones, POC UA negative negative   Spec Grav, UA 1.010    Blood, UA large (A) negative   pH, UA 5.0    Protein Ur, POC negative negative   Urobilinogen, UA 0.2    Nitrite, UA Negative Negative   ukocytes, UA Negative Negative  POCT CBC  Result Value Ref Range   WBC 6.9 4.6 - 10.2 K/uL   Lymph, poc 2.5 0.6 - 3.4   POC LYMPH PERCENT 35.8 10 - 50 %L   MID (cbc) 0.3 0 - 0.9   POC MID % 4.9 0 - 12 %M   POC Granulocyte 4.1 2 - 6.9   Granulocyte percent 59.3 37 - 80 %G   RBC 5.37 4.69 - 6.13 M/uL   Hemoglobin 16.0 14.1 - 18.1 g/dL   HCT, POC 16.145.8 09.643.5 - 53.7 %   MCV 85.4 80 - 97 fL   MCH, POC 29.9 27 - 31.2 pg   MCHC 35.0 31.8 - 35.4 g/dL   RDW, POC 04.513.3 %   Platelet Count, POC 181 142 - 424 K/uL   MPV 7.7 0 - 99.8 fL     EKG/XRAY:   Primary [facility] ? Increase vascualr markings right lower lobe versus PNA/bronchitis Right bladder phlebolith, no obvious ureteral stones + stool   ASSESSMENT/PLAN: Encounter Diagnoses  Name Primary?  . Urinary frequency   . ft flank pain Yes  . History of renal stone   . Lower respiratory infection    Multifactorial cause of back pain: pluritic CP/back pain due to coughing from LRI vs renal stone vs constipation Rx azithromycin Encourage to take  fluids , miralax prn  He states he does not want any pain meds , he will just use ibuprofen  He will fu prn if sxs do not imrpove CMP pending   Gross sideeffects, risk and benefits, and alternatives of medications d/w patient. Patient is aware that all medications have potential sideeffects and we are unable to predict every sideeffect or drug-drug interaction that may occur.    DO  01/06/2015 8:50 PM

## 2015-01-07 LAB — COMPLETE METABOLIC PANEL WITH GFR
AST: 34 U/L (ref 10–40)
Albumin: 4.5 g/dL (ref 3.6–5.1)
Alkaline Phosphatase: 95 U/L (ref 40–115)
Calcium: 9.4 mg/dL (ref 8.6–10.3)
Chloride: 102 mmol/L (ref 98–110)
GFR, Est African American: >89 mL/min (ref >=60)
GFR, Est Non African American: >89 mL/min (ref >=60)
Potassium: 4.6 mmol/L (ref 3.5–5.3)

## 2015-01-07 LAB — COMPLETE METABOLIC PANEL WITHOUT GFR
ALT: 74 U/L — ABNORMAL HIGH (ref 9–46)
BUN: 12 mg/dL (ref 7–25)
CO2: 25 mmol/L (ref 20–31)
Creat: 0.85 mg/dL (ref 0.60–1.35)
Glucose, Bld: 114 mg/dL — ABNORMAL HIGH (ref 65–99)
Sodium: 140 mmol/L (ref 135–146)
Total Bilirubin: 0.4 mg/dL (ref 0.2–1.2)
Total Protein: 7.2 g/dL (ref 6.1–8.1)

## 2015-03-24 ENCOUNTER — Telehealth: Payer: Self-pay

## 2015-07-14 ENCOUNTER — Ambulatory Visit (INDEPENDENT_AMBULATORY_CARE_PROVIDER_SITE_OTHER): Payer: BLUE CROSS/BLUE SHIELD | Admitting: Emergency Medicine

## 2015-07-14 ENCOUNTER — Encounter: Payer: Self-pay | Admitting: Emergency Medicine

## 2015-07-14 VITALS — BP 120/82 | HR 75 | Temp 97.7°F | Resp 16 | Ht 69.0 in | Wt 283.6 lb

## 2015-07-14 DIAGNOSIS — R739 Hyperglycemia, unspecified: Secondary | ICD-10-CM | POA: Diagnosis not present

## 2015-07-14 DIAGNOSIS — Z87442 Personal history of urinary calculi: Secondary | ICD-10-CM | POA: Diagnosis not present

## 2015-07-14 DIAGNOSIS — Z0000 Encounter for general adult medical examination without abnormal findings: Secondary | ICD-10-CM | POA: Diagnosis not present

## 2015-07-14 DIAGNOSIS — Z114 Encounter for screening for human immunodeficiency virus [HIV]: Secondary | ICD-10-CM | POA: Diagnosis not present

## 2015-07-14 DIAGNOSIS — Z1159 Encounter for screening for other viral diseases: Secondary | ICD-10-CM | POA: Diagnosis not present

## 2015-07-14 DIAGNOSIS — R1011 Right upper quadrant pain: Secondary | ICD-10-CM

## 2015-07-14 DIAGNOSIS — N62 Hypertrophy of breast: Secondary | ICD-10-CM | POA: Diagnosis not present

## 2015-07-14 DIAGNOSIS — E785 Hyperlipidemia, unspecified: Secondary | ICD-10-CM

## 2015-07-14 DIAGNOSIS — Z13220 Encounter for screening for lipoid disorders: Secondary | ICD-10-CM

## 2015-07-14 LAB — POC MICROSCOPIC URINALYSIS (UMFC): MUCUS RE: ABSENT

## 2015-07-14 LAB — POCT URINALYSIS DIP (MANUAL ENTRY)
Bilirubin, UA: NEGATIVE
Blood, UA: NEGATIVE
Glucose, UA: NEGATIVE
LEUKOCYTES UA: NEGATIVE
NITRITE UA: NEGATIVE
PH UA: 5
PROTEIN UA: NEGATIVE
Spec Grav, UA: 1.02
UROBILINOGEN UA: 0.2

## 2015-07-14 LAB — CBC WITH DIFFERENTIAL/PLATELET
BASOS ABS: 50 {cells}/uL (ref 0–200)
Basophils Relative: 1 %
EOS PCT: 3 %
Eosinophils Absolute: 150 cells/uL (ref 15–500)
HCT: 43.5 % (ref 38.5–50.0)
Hemoglobin: 15.1 g/dL (ref 13.2–17.1)
Lymphocytes Relative: 37 %
Lymphs Abs: 1850 cells/uL (ref 850–3900)
MCH: 29.2 pg (ref 27.0–33.0)
MCHC: 34.7 g/dL (ref 32.0–36.0)
MCV: 84 fL (ref 80.0–100.0)
MONOS PCT: 7 %
MPV: 10.6 fL (ref 7.5–12.5)
Monocytes Absolute: 350 cells/uL (ref 200–950)
NEUTROS ABS: 2600 {cells}/uL (ref 1500–7800)
Neutrophils Relative %: 52 %
PLATELETS: 218 10*3/uL (ref 140–400)
RBC: 5.18 MIL/uL (ref 4.20–5.80)
RDW: 13.5 % (ref 11.0–15.0)
WBC: 5 10*3/uL (ref 3.8–10.8)

## 2015-07-14 LAB — LIPID PANEL
CHOL/HDL RATIO: 3.8 ratio (ref <=5.0)
Cholesterol: 154 mg/dL (ref 125–200)
HDL: 41 mg/dL (ref >=40)
LDL CALC: 95 mg/dL (ref <130)
Triglycerides: 89 mg/dL (ref <150)
VLDL: 18 mg/dL (ref <30)

## 2015-07-14 LAB — COMPLETE METABOLIC PANEL WITH GFR
ALT: 66 U/L — AB (ref 9–46)
AST: 42 U/L — AB (ref 10–40)
Albumin: 4.3 g/dL (ref 3.6–5.1)
Alkaline Phosphatase: 83 U/L (ref 40–115)
BILIRUBIN TOTAL: 0.6 mg/dL (ref 0.2–1.2)
BUN: 13 mg/dL (ref 7–25)
CALCIUM: 8.6 mg/dL (ref 8.6–10.3)
CHLORIDE: 105 mmol/L (ref 98–110)
CO2: 20 mmol/L (ref 20–31)
CREATININE: 0.68 mg/dL (ref 0.60–1.35)
GFR, Est Non African American: >89 mL/min (ref >=60)
Glucose, Bld: 90 mg/dL (ref 65–99)
Potassium: 4.1 mmol/L (ref 3.5–5.3)
Sodium: 138 mmol/L (ref 135–146)
TOTAL PROTEIN: 6.6 g/dL (ref 6.1–8.1)

## 2015-07-14 LAB — TSH: TSH: 1.42 m[IU]/L (ref 0.40–4.50)

## 2015-07-14 NOTE — Patient Instructions (Addendum)
   IF you received an x-ray today, you will receive an invoice from [facility]. Please contact [facility] at [phone] with questions or concerns regarding your invoice.   IF you received labwork today, you will receive an invoice from [facility]. Please contact [facility] at [phone] with questions or concerns regarding your invoice.   Our billing staff will not be able to assist you with questions regarding bills from these companies.  You will be contacted with the lab results as soon as they are available. The fastest way to get your results is to activate your My Chart account. Instructions are located on the last page of this paperwork. If you have not heard from us regarding the results in 2 weeks, please contact this office.    Health Maintenance, Male A healthy lifestyle and preventative care can promote health and wellness.  Maintain regular health, dental, and eye exams.  Eat a healthy diet. Foods like vegetables, fruits, whole grains, low-fat dairy products, and lean protein foods contain the nutrients you need and are low in calories. Decrease your intake of foods high in solid fats, added sugars, and salt. Get information about a proper diet from your health care provider, if necessary.  Regular physical exercise is one of the most important things you can do for your health. Most adults should get at least 150 minutes of moderate-intensity exercise (any activity that increases your heart rate and causes you to sweat) each week. In addition, most adults need muscle-strengthening exercises on 2 or more days a week.   Maintain a healthy weight. The body mass index (BMI) is a screening tool to identify possible weight problems. It provides an estimate of body fat based on height and weight. Your health care provider can find your BMI and can help you achieve or maintain a healthy weight. For males 20 years and older:  A BMI  below 18.5 is considered underweight.  A BMI of 18.5 to 24.9 is normal.  A BMI of 25 to 29.9 is considered overweight.  A BMI of 30 and above is considered obese.  Maintain normal blood lipids and cholesterol by exercising and minimizing your intake of saturated fat. Eat a balanced diet with plenty of fruits and vegetables. Blood tests for lipids and cholesterol should begin at age 20 and be repeated every 5 years. If your lipid or cholesterol levels are high, you are over age 50, or you are at high risk for heart disease, you may need your cholesterol levels checked more frequently.Ongoing high lipid and cholesterol levels should be treated with medicines if diet and exercise are not working.  If you smoke, find out from your health care provider how to quit. If you do not use tobacco, do not start.  Lung cancer screening is recommended for adults aged 55-80 years who are at high risk for developing lung cancer because of a history of smoking. A yearly low-dose CT scan of the lungs is recommended for people who have at least a 30-pack-year history of smoking and are current smokers or have quit within the past 15 years. A pack year of smoking is smoking an average of 1 pack of cigarettes a day for 1 year (for example, a 30-pack-year history of smoking could mean smoking 1 pack a day for 30 years or 2 packs a day for 15 years). Yearly screening should continue until the smoker has stopped smoking for at least 15 years. Yearly screening should be stopped   for people who develop a health problem that would prevent them from having lung cancer treatment.  If you choose to drink alcohol, do not have more than 2 drinks per day. One drink is considered to be 12 oz (360 mL) of beer, 5 oz (150 mL) of wine, or 1.5 oz (45 mL) of liquor.  Avoid the use of street drugs. Do not share needles with anyone. Ask for help if you need support or instructions about stopping the use of drugs.  High blood pressure  causes heart disease and increases the risk of stroke. High blood pressure is more likely to develop in:  People who have blood pressure in the end of the normal range (100-139/85-89 mm Hg).  People who are overweight or obese.  People who are African American.  If you are 18-39 years of age, have your blood pressure checked every 3-5 years. If you are 40 years of age or older, have your blood pressure checked every year. You should have your blood pressure measured twice--once when you are at a hospital or clinic, and once when you are not at a hospital or clinic. Record the average of the two measurements. To check your blood pressure when you are not at a hospital or clinic, you can use:  An automated blood pressure machine at a pharmacy.  A home blood pressure monitor.  If you are 45-79 years old, ask your health care provider if you should take aspirin to prevent heart disease.  Diabetes screening involves taking a blood sample to check your fasting blood sugar level. This should be done once every 3 years after age 45 if you are at a normal weight and without risk factors for diabetes. Testing should be considered at a younger age or be carried out more frequently if you are overweight and have at least 1 risk factor for diabetes.  Colorectal cancer can be detected and often prevented. Most routine colorectal cancer screening begins at the age of 50 and continues through age 75. However, your health care provider may recommend screening at an earlier age if you have risk factors for colon cancer. On a yearly basis, your health care provider may provide home test kits to check for hidden blood in the stool. A small camera at the end of a tube may be used to directly examine the colon (sigmoidoscopy or colonoscopy) to detect the earliest forms of colorectal cancer. Talk to your health care provider about this at age 50 when routine screening begins. A direct exam of the colon should be repeated  every 5-10 years through age 75, unless early forms of precancerous polyps or small growths are found.  People who are at an increased risk for hepatitis B should be screened for this virus. You are considered at high risk for hepatitis B if:  You were born in a country where hepatitis B occurs often. Talk with your health care provider about which countries are considered high risk.  Your parents were born in a high-risk country and you have not received a shot to protect against hepatitis B (hepatitis B vaccine).  You have HIV or AIDS.  You use needles to inject street drugs.  You live with, or have sex with, someone who has hepatitis B.  You are a man who has sex with other men (MSM).  You get hemodialysis treatment.  You take certain medicines for conditions like cancer, organ transplantation, and autoimmune conditions.  Hepatitis C blood testing is recommended for   all people born from 1945 through 1965 and any individual with known risk factors for hepatitis C.  Healthy men should no longer receive prostate-specific antigen (PSA) blood tests as part of routine cancer screening. Talk to your health care provider about prostate cancer screening.  Testicular cancer screening is not recommended for adolescents or adult males who have no symptoms. Screening includes self-exam, a health care provider exam, and other screening tests. Consult with your health care provider about any symptoms you have or any concerns you have about testicular cancer.  Practice safe sex. Use condoms and avoid high-risk sexual practices to reduce the spread of sexually transmitted infections (STIs).  You should be screened for STIs, including gonorrhea and chlamydia if:  You are sexually active and are younger than 24 years.  You are older than 24 years, and your health care provider tells you that you are at risk for this type of infection.  Your sexual activity has changed since you were last screened,  and you are at an increased risk for chlamydia or gonorrhea. Ask your health care provider if you are at risk.  If you are at risk of being infected with HIV, it is recommended that you take a prescription medicine daily to prevent HIV infection. This is called pre-exposure prophylaxis (PrEP). You are considered at risk if:  You are a man who has sex with other men (MSM).  You are a heterosexual man who is sexually active with multiple partners.  You take drugs by injection.  You are sexually active with a partner who has HIV.  Talk with your health care provider about whether you are at high risk of being infected with HIV. If you choose to begin PrEP, you should first be tested for HIV. You should then be tested every 3 months for as long as you are taking PrEP.  Use sunscreen. Apply sunscreen liberally and repeatedly throughout the day. You should seek shade when your shadow is shorter than you. Protect yourself by wearing long sleeves, pants, a wide-brimmed hat, and sunglasses year round whenever you are outdoors.  Tell your health care provider of new moles or changes in moles, especially if there is a change in shape or color. Also, tell your health care provider if a mole is larger than the size of a pencil eraser.  A one-time screening for abdominal aortic aneurysm (AAA) and surgical repair of large AAAs by ultrasound is recommended for men aged 65-75 years who are current or former smokers.  Stay current with your vaccines (immunizations).   This information is not intended to replace advice given to you by your health care provider. Make sure you discuss any questions you have with your health care provider.   Document Released: 08/11/2007 Document Revised: 03/05/2014 Document Reviewed: 07/10/2010 [employer] Interactive Patient Education 2016 [employer]  

## 2015-07-14 NOTE — Progress Notes (Signed)
By signing my name below, I, Mesha Guinyard, attest that this documentation has been prepared under the direction and in the presence of Lesle ChrisSteven Zoeie Ritter, MD.  Electronically Signed: Arvilla MarketMesha Guinyard, Medical Scribe. 07/14/2015. 2:47 PM.  Chief Complaint:  Chief Complaint  Patient presents with  . Annual Exam  . form to fill out with bloodwork    HPI: Mathew Greer is a 45 y.o. male with a PMHx of obesity who [facility]. Pt states everything is good. Pt reports concern of having a fatty liver due to intermittent pain located below his right ribs, his liver enzymes were 1-2 points over, and his obesity. Pt states he exercises. Last fall he bicycled to his job about twice a week. He wants to get back into it now that the weather is nice. 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 Pt is considering weight lifting for his exercise. Pt mentions concern about his obesity stopping him from getting a job in his field.  Pt mentions his family is good. 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 Pt plans on moving out the state for work in [employer]. His sister received her MD and just got her residency in Peds in [place].  Past Medical History  Diagnosis Date  . Overweight(278.02)   . Allergy   . Condylomata acuminata   . Asthmatic bronchitis   . Nephrolithiasis   . Gynecomastia   . Elevated LFTs     HEPATITIS PANEL NEGATIVE   Past Surgical History  Procedure Laterality Date  . Hernia repair  2nd grade   Social History   Social History  . Marital Status: Married    Spouse Name: Maralyn SagoSarah  . Number of Children: 2  . Years of Education: 18+   Occupational History  . Geographical Information  Science    Social History Main Topics  . Smoking status: Former Smoker -- 0.25 packs/day for 15 years    Types: Cigarettes    Quit date: 02/27/2007  . Smokeless tobacco: Never Used     Comment: when in [employer]  smoked about 1 ppd. Smoked 15 yrs off and on   . Alcohol Use: 1.8 oz/week    3 Glasses of wine per week     Comment: 2 glasses per week  . Drug Use: No  . Sexual Activity:    Partners: Female    CopyBirth Control/ Protection: None   Other Topics Concern  . None   Social History Narrative   Working on PhD.  Lives with his wife and their 2 daughters.   Family History  Problem Relation Age of Onset  . Diabetes Father   . Depression Father   . Mental illness Father   . Stroke Maternal Grandmother   . Cancer Maternal Grandfather     lung  . Alzheimer's disease Paternal Grandmother   . Cancer Paternal Grandfather     lung   No Known Allergies Prior to Admission medications   Medication Sig Start Date End Date Taking? Authorizing Provider  fexofenadine (ALLEGRA) 180 MG tablet Take 180 mg by mouth as needed.    Yes Historical Provider, MD  ibuprofen (ADVIL,MOTRIN) 200 MG tablet Take 200 mg by mouth every 6 (six) hours as needed.   Yes Historical Provider, MD  tamsulosin (FLOMAX) 0.4 MG CAPS capsule Take 1 capsule (0.4 mg total) by mouth daily. Patient not taking: Reported on 07/14/2015 01/06/15  Thao P Le, DO     ROS: The patient denies fevers, chills, night sweats, unintentional weight loss, chest pain, palpitations, wheezing, dyspnea on exertion, nausea, vomiting, dysuria, hematuria, melena, numbness, weakness, or tingling. Pt has upper right abdominal pain.  All other systems have been reviewed and were otherwise negative with the exception of those mentioned in the HPI and as above.    PHYSICAL EXAM: Filed Vitals:   07/14/15 1431 07/14/15 1434  BP: 140/90 120/82  Pulse: 75   Temp: 97.7 F (36.5 C)   Resp: 16    Body mass index is 41.86 kg/(m^2).   General: Alert, no acute distress. Morbid obesity. HEENT:  Normocephalic, atraumatic, oropharynx patent. Eye: Nonie H[facility] Cardiovascular:  Regular rate and rhythm, no rubs murmurs or gallops.  No Carotid bruits, radial  pulse intact. No pedal edema.  Respiratory: Clear to auscultation bilaterally.  No wheezes, rales, or rhonchi.  No cyanosis, no use of accessory musculature Abdominal: No organomegaly, abdomen is soft and non-tender, positive bowel sounds.  No masses. Musculoskeletal: Gait intact. No edema, tenderness. Bilateral gynecomastia. GU: Prostate is nl. Skin: No rashes. Neurologic: Facial musculature symmetric. Psychiatric: Patient acts appropriately throughout our interaction. Lymphatic: No cervical or submandibular lymphadenopathy   LABS: Results for orders placed or performed in visit on 07/14/15  POCT urinalysis dipstick  Result Value Ref Range   Color, UA yellow yellow   Clarity, UA clear clear   Glucose, UA negative negative   Bilirubin, UA negative negative   Ketones, POC UA trace (5) (A) negative   Spec Grav, UA 1.020    Blood, UA negative negative   pH, UA 5.0    Protein Ur, POC negative negative   Urobilinogen, UA 0.2    Nitrite, UA Negative Negative   Leukocytes, UA Negative Negative  POCT Microscopic Urinalysis ([facility])  Result Value Ref Range   WBC,UR,HPF,POC None None WBC/hpf   RBC,UR,HPF,POC None None RBC/hpf   Bacteria None None, Too numerous to count   Mucus Absent Absent   Epithelial Cells, UR Per Microscopy None None, Too numerous to count cells/hpf   EKG/XRAY:   Primary read interpreted by Dr. Cleta Alberts at [facility].   ASSESSMENT/PLAN: Patient's biggest problem is obesity. He is not currently interested in any type of gastric procedure. We talked a lot about exercise and trying to lose weight. He has had some trouble with gynecomastia and I ordered a prolactin level and a mammogram. He also has had some right flank upper abdominal pain. His sugar was normal. I ordered an ultrasound to look at his liver and gallbladder as well as the right kidney.I personally performed the services described in this documentation, which was scribed in my presence. The recorded information has  been reviewed and is accurate. I personally performed the services described in this documentation, which was scribed in my presence. The recorded information has been reviewed and is accurate.  Gross sideeffects, risk and benefits, and alternatives of medications d/w patient. Patient is aware that all medications have potential sideeffects and we are unable to predict every sideeffect or drug-drug interaction that may occur.  Lesle Chris MD 07/14/2015 2:47 PM

## 2015-07-15 ENCOUNTER — Telehealth: Payer: Self-pay | Admitting: Emergency Medicine

## 2015-07-15 LAB — PROLACTIN: Prolactin: 6.1 ng/mL (ref 2.0–18.0)

## 2015-07-15 LAB — HEMOGLOBIN A1C
Hgb A1c MFr Bld: 5.4 % (ref <5.7)
MEAN PLASMA GLUCOSE: 108 mg/dL

## 2015-07-15 LAB — HEPATITIS C ANTIBODY: HCV AB: NEGATIVE

## 2015-07-15 LAB — HIV ANTIBODY (ROUTINE TESTING W REFLEX): HIV: NONREACTIVE

## 2015-07-15 LAB — PSA: PSA: 0.57 ng/mL (ref <=4.00)

## 2015-07-15 NOTE — Telephone Encounter (Signed)
Patient was seen at the appointment center on 07/14/2015 scheduled with Daub. He gave a form to his assistant for Dr. Cleta Albertsaub to complete. Patient received a copy of the form before he left. It is a health form for health. [phone].

## 2015-07-15 NOTE — Telephone Encounter (Signed)
Dr. Cleta Albertsaub, this pt is looking for a completed health form that was supposed to be filled out by you and faxed over to him.

## 2015-07-15 NOTE — Addendum Note (Signed)
Addended by: Marcellus ScottADKINS, [place] Hair L on: 07/15/2015 02:35 PM   Modules accepted: Orders

## 2015-07-16 NOTE — Telephone Encounter (Signed)
Please call patient and let him know I mailed him his paperwork

## 2015-07-18 LAB — TESTOSTERONE, FREE AND TOTAL (INCLUDES SHBG)-(MALES)
Sex Hormone Binding: 41 nmol/L (ref 10–50)
TESTOSTERONE FREE: 50.2 pg/mL (ref 47.0–244.0)
TESTOSTERONE-% FREE: 1.7 % (ref 1.6–2.9)
Testosterone: 293 ng/dL (ref 250–827)

## 2015-07-18 NOTE — Telephone Encounter (Signed)
Spoke with pt, advised him of dr [employer]daub message.

## 2015-09-05 ENCOUNTER — Other Ambulatory Visit: Payer: Self-pay | Admitting: *Deleted

## 2015-09-05 DIAGNOSIS — N62 Hypertrophy of breast: Secondary | ICD-10-CM

## 2015-11-21 ENCOUNTER — Ambulatory Visit: Payer: BLUE CROSS/BLUE SHIELD | Admitting: Pulmonary Disease

## 2016-02-07 ENCOUNTER — Ambulatory Visit (INDEPENDENT_AMBULATORY_CARE_PROVIDER_SITE_OTHER): Payer: BC Managed Care – PPO | Admitting: Family Medicine

## 2016-02-07 ENCOUNTER — Encounter: Payer: Self-pay | Admitting: Family Medicine

## 2016-02-07 VITALS — BP 146/84 | HR 79 | Temp 98.5°F | Resp 18 | Ht 69.0 in | Wt 290.2 lb

## 2016-02-07 DIAGNOSIS — K429 Umbilical hernia without obstruction or gangrene: Secondary | ICD-10-CM

## 2016-02-07 DIAGNOSIS — G4733 Obstructive sleep apnea (adult) (pediatric): Secondary | ICD-10-CM

## 2016-02-07 DIAGNOSIS — Z23 Encounter for immunization: Secondary | ICD-10-CM

## 2016-02-07 NOTE — Progress Notes (Signed)
Subjective:    Patient ID: Mathew Greer, male    DOB: Apr 21, 1970, 45 y.o.   MRN: [id number]  02/07/2016  Hernia (possible hernia around the umbilical area; More on the left side; states it painful and felt something hard; caused probably from excessive coughing) and Flu Vaccine   HPI This 45 y.o. male presents for evaluation of possible hernia.  Family suffered with gastroenteritis.  Thinks might have aspirated vomit; has been having a nasty cough with sputum production in the morning.  While coughing, has noticed umbilical pain. Stood up against sink and hit umbilicus on edge with pain and hard indurated area.  Researched online.  No further pain this morning.  Regular bowel movements.  No pain at rest.  Pain with palpation.  Protrusion with coughing.   Obesity: has gained a lot of weight.  Working out.  Has been gaining weight since high school.  https://www.matthews.info/; tracks stuff all the time.  Works out three days per week.  Doing weight lifting.  Does elliptical with goal of 160.  Lifts. Diet sodas only; rare intake, Drinks coffee and water.  Drinks wine.  Makes wine; 3 glasses per week. 2500-3000 kcal.   Atkins bar is satisfying for breakfast or shake. L: sandwich, carrots, pickle, hummus,  D: chipotle beans rice.  Rare eating out.  Will be moving now; has applied to schools.   Has considered the sleeve; seems drastic.   OSA on CPAP: maintained on CPAP.  BP Readings from Last 3 Encounters:  02/07/16 (!) 146/84  07/14/15 120/82  01/06/15 134/78   Wt Readings from Last 3 Encounters:  02/07/16 290 lb 3.2 oz (131.6 kg)  07/14/15 283 lb 9.6 oz (128.6 kg)  01/06/15 282 lb (127.9 kg)   Immunization History  Administered Date(s) Administered  . Influenza Split 02/08/2011  . Influenza,inj,Quad PF,36+ Mos 03/30/2013, 04/21/2014, 02/07/2016  . Tdap 07/15/2006    Review of Systems  Constitutional: Negative for activity change, appetite change, chills, diaphoresis, fatigue and fever.    Respiratory: Negative for cough and shortness of breath.   Cardiovascular: Negative for chest pain, palpitations and leg swelling.  Gastrointestinal: Negative for abdominal pain, diarrhea, nausea and vomiting.  Endocrine: Negative for cold intolerance, heat intolerance, polydipsia, polyphagia and polyuria.  Skin: Negative for color change, rash and wound.  Neurological: Negative for dizziness, tremors, seizures, syncope, facial asymmetry, speech difficulty, weakness, light-headedness, numbness and headaches.  Psychiatric/Behavioral: Negative for dysphoric mood and sleep disturbance. The patient is not nervous/anxious.     Past Medical History:  Diagnosis Date  . Allergy   . Asthmatic bronchitis   . Condylomata acuminata   . Elevated LFTs    HEPATITIS PANEL NEGATIVE  . Gynecomastia   . Nephrolithiasis   . Overweight(278.02)    Past Surgical History:  Procedure Laterality Date  . HERNIA REPAIR  2nd grade   No Known Allergies Current Outpatient Prescriptions  Medication Sig Dispense Refill  . fexofenadine (ALLEGRA) 180 MG tablet Take 180 mg by mouth as needed.     Marland Kitchen ibuprofen (ADVIL,MOTRIN) 200 MG tablet Take 200 mg by mouth every 6 (six) hours as needed.     No current facility-administered medications for this visit.    Social History   Social History  . Marital status: Married    Spouse name: Judson Roch  . Number of children: 2  . Years of education: 18+   Occupational History  . Geographical Information  Science Corporate treasurer   Social History Main Topics  .  Smoking status: Former Smoker    Packs/day: 0.25    Years: 15.00    Types: Cigarettes    Quit date: 02/27/2007  . Smokeless tobacco: Never Used     Comment: when in [employer] smoked about 1 ppd. Smoked 15 yrs off and on   . Alcohol use 1.8 oz/week    3 Glasses of wine per week     Comment: 2 glasses per week  . Drug use: No  . Sexual activity: Yes    Partners: Female    Birth control/ protection: None   Other  Topics Concern  . Not on file   Social History Narrative   Working on PhD.  Lives with his wife and their 2 daughters.   Family History  Problem Relation Age of Onset  . Diabetes Father   . Depression Father   . Mental illness Father   . Stroke Maternal Grandmother   . Cancer Maternal Grandfather     lung  . Alzheimer's disease Paternal Grandmother   . Cancer Paternal Grandfather     lung       Objective:    BP (!) 146/84 (BP Location: Left Arm, Patient Position: Sitting, Cuff Size: Large)   Pulse 79   Temp 98.5 F (36.9 C) (Oral)   Resp 18   Ht _0  (1.753 m)   Wt 290 lb 3.2 oz (131.6 kg)   SpO2 96%   BMI 42.86 kg/m  Physical Exam  Constitutional: He is oriented to person, place, and time. He appears well-developed and well-nourished. No distress.  HENT:  Head: Normocephalic and atraumatic.  Right Ear: External ear normal.  Left Ear: External ear normal.  Nose: Nose normal.  Mouth/Throat: Oropharynx is clear and moist.  Eyes: Conjunctivae and EOM are normal. Pupils are equal, round, and reactive to light.  Neck: Normal range of motion. Neck supple. Carotid bruit is not present. No thyromegaly present.  Cardiovascular: Normal rate, regular rhythm, normal heart sounds and intact distal pulses.  Exam reveals no gallop and no friction rub.   No murmur heard. Pulmonary/Chest: Effort normal and breath sounds normal. He has no wheezes. He has no rales.  Abdominal: Soft. Bowel sounds are normal. He exhibits no distension and no mass. There is no tenderness. There is no rebound and no guarding. A hernia is present.  Moderate sized umbilical hernia non-tender; partially reducible.  Lymphadenopathy:    He has no cervical adenopathy.  Neurological: He is alert and oriented to person, place, and time. No cranial nerve deficit.  Skin: Skin is warm and dry. No rash noted. He is not diaphoretic.  Psychiatric: He has a normal mood and affect. His behavior is normal.  Nursing note  and vitals reviewed.  Results for orders placed or performed in visit on 07/14/15  CBC with Differential/Platelet  Result Value Ref Range   WBC 5.0 3.8 - 10.8 K/uL   RBC 5.18 4.20 - 5.80 MIL/uL   Hemoglobin 15.1 13.2 - 17.1 g/dL   HCT 43.5 38.5 - 50.0 %   MCV 84.0 80.0 - 100.0 fL   MCH 29.2 27.0 - 33.0 pg   MCHC 34.7 32.0 - 36.0 g/dL   RDW 13.5 11.0 - 15.0 %   Platelets 218 140 - 400 K/uL   MPV 10.6 7.5 - 12.5 fL   Neutro Abs 2,600 1,500 - 7,800 cells/uL   Lymphs Abs 1,850 850 - 3,900 cells/uL   Monocytes Absolute 350 200 - 950 cells/uL   Eosinophils Absolute  150 15 - 500 cells/uL   Basophils Absolute 50 0 - 200 cells/uL   Neutrophils Relative % 52 %   Lymphocytes Relative 37 %   Monocytes Relative 7 %   Eosinophils Relative 3 %   Basophils Relative 1 %   Smear Review Criteria for review not met   COMPLETE METABOLIC PANEL WITH GFR  Result Value Ref Range   Sodium 138 135 - 146 mmol/L   Potassium 4.1 3.5 - 5.3 mmol/L   Chloride 105 98 - 110 mmol/L   CO2 20 20 - 31 mmol/L   Glucose, Bld 90 65 - 99 mg/dL   BUN 13 7 - 25 mg/dL   Creat 0.68 0.60 - 1.35 mg/dL   Total Bilirubin 0.6 0.2 - 1.2 mg/dL   Alkaline Phosphatase 83 40 - 115 U/L   AST 42 (H) 10 - 40 U/L   ALT 66 (H) 9 - 46 U/L   Total Protein 6.6 6.1 - 8.1 g/dL   Albumin 4.3 3.6 - 5.1 g/dL   Calcium 8.6 8.6 - 10.3 mg/dL   GFR, Est African American >89 >=60 mL/min   GFR, Est Non African American >89 >=60 mL/min  HIV antibody  Result Value Ref Range   HIV 1&2 Ab, 4th Generation NONREACTIVE NONREACTIVE  Hepatitis C antibody  Result Value Ref Range   HCV Ab NEGATIVE NEGATIVE  Lipid panel  Result Value Ref Range   Cholesterol 154 125 - 200 mg/dL   Triglycerides 89 <150 mg/dL   HDL 41 >=40 mg/dL   Total CHOL/HDL Ratio 3.8 <=5.0 Ratio   VLDL 18 <30 mg/dL   LDL Cholesterol 95 <130 mg/dL  PSA  Result Value Ref Range   PSA 0.57 <=4.00 ng/mL  TSH  Result Value Ref Range   TSH 1.42 0.40 - 4.50 mIU/L  Prolactin    Result Value Ref Range   Prolactin 6.1 2.0 - 18.0 ng/mL  Hemoglobin A1c  Result Value Ref Range   Hgb A1c MFr Bld 5.4 <5.7 %   Mean Plasma Glucose 108 mg/dL  TESTOSTERONE, FREE AND TOTAL (INCLUDES SHBG)-(MALES)  Result Value Ref Range   Testosterone 293 250 - 827 ng/dL   Sex Hormone Binding 41 10 - 50 nmol/L   Testosterone, Free 50.2 47.0 - 244.0 pg/mL   Testosterone-% Free 1.7 1.6 - 2.9 %  POCT urinalysis dipstick  Result Value Ref Range   Color, UA yellow yellow   Clarity, UA clear clear   Glucose, UA negative negative   Bilirubin, UA negative negative   Ketones, POC UA trace (5) (A) negative   Spec Grav, UA 1.020    Blood, UA negative negative   pH, UA 5.0    Protein Ur, POC negative negative   Urobilinogen, UA 0.2    Nitrite, UA Negative Negative   Leukocytes, UA Negative Negative  POCT Microscopic Urinalysis ([facility])  Result Value Ref Range   WBC,UR,HPF,POC None None WBC/hpf   RBC,UR,HPF,POC None None RBC/hpf   Bacteria None None, Too numerous to count   Mucus Absent Absent   Epithelial Cells, UR Per Microscopy None None, Too numerous to count cells/hpf       Assessment & Plan:   1. Umbilical hernia without obstruction and without gangrene   2. Need for influenza vaccination   3. Severe obesity (BMI >= 40) (HCC)   4. OSA (obstructive sleep apnea)    -New onset umbilical hernia; refer to general surgery for consultation.  To ED for severe pain, vomiting, fever,  etc. -discussed morbid obesity and weight loss including exercise, low-calorie food choices; also placed referral to bariatric surgeon to discuss bariatric options.  Recommend attending free seminar on bariatric surgeries.  Reluctant to prescribe appetite suppressants at first visit.    Orders Placed This Encounter  Procedures  . Flu Vaccine QUAD 36+ mos IM  . Ambulatory referral to General Surgery    Referral Priority:   Routine    Referral Type:   Surgical    Referral Reason:   Specialty Services  Required    Requested Specialty:   General Surgery    Number of Visits Requested:   1  . Ambulatory referral to General Surgery    Referral Priority:   Routine    Referral Type:   Surgical    Referral Reason:   Specialty Services Required    Requested Specialty:   General Surgery    Number of Visits Requested:   1   No orders of the defined types were placed in this encounter.   No Follow-up on file.   Jahmiya Guidotti Elayne Guerin, M.D. [facility] [address] [place], [place]  [address] [phone] phone [phone] fax

## 2016-02-07 NOTE — Patient Instructions (Signed)
     IF you received an x-ray today, you will receive an invoice from [facility]. Please contact [facility] at [phone] with questions or concerns regarding your invoice.   IF you received labwork today, you will receive an invoice from [facility]. Please contact [facility] at [phone] with questions or concerns regarding your invoice.   Our billing staff will not be able to assist you with questions regarding bills from these companies.  You will be contacted with the lab results as soon as they are available. The fastest way to get your results is to activate your My Chart account. Instructions are located on the last page of this paperwork. If you have not heard from us regarding the results in 2 weeks, please contact this office.      

## 2017-07-18 ENCOUNTER — Encounter: Payer: Self-pay | Admitting: Family Medicine

## 2017-10-22 IMAGING — CR DG ABDOMEN ACUTE W/ 1V CHEST
4 series · 4 of 4 positions shown · non-contrast
Comparison: 09/30/2013

CLINICAL DATA: Left low back pain.  History of renal stones.

EXAM:
DG ABDOMEN ACUTE W/ 1V CHEST

[PA]
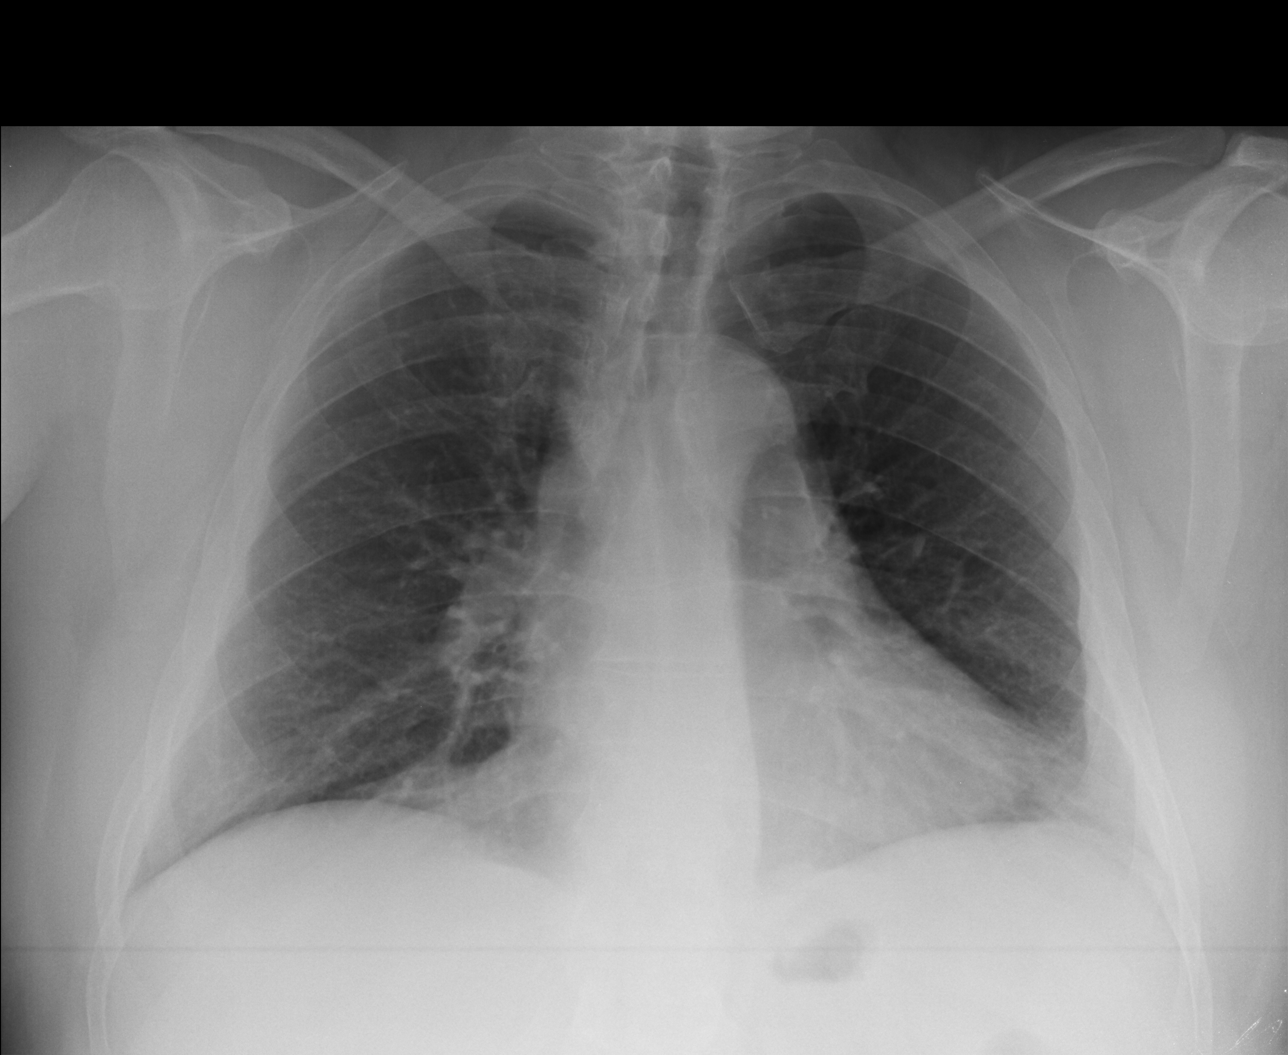

[AP (1 of 3)]
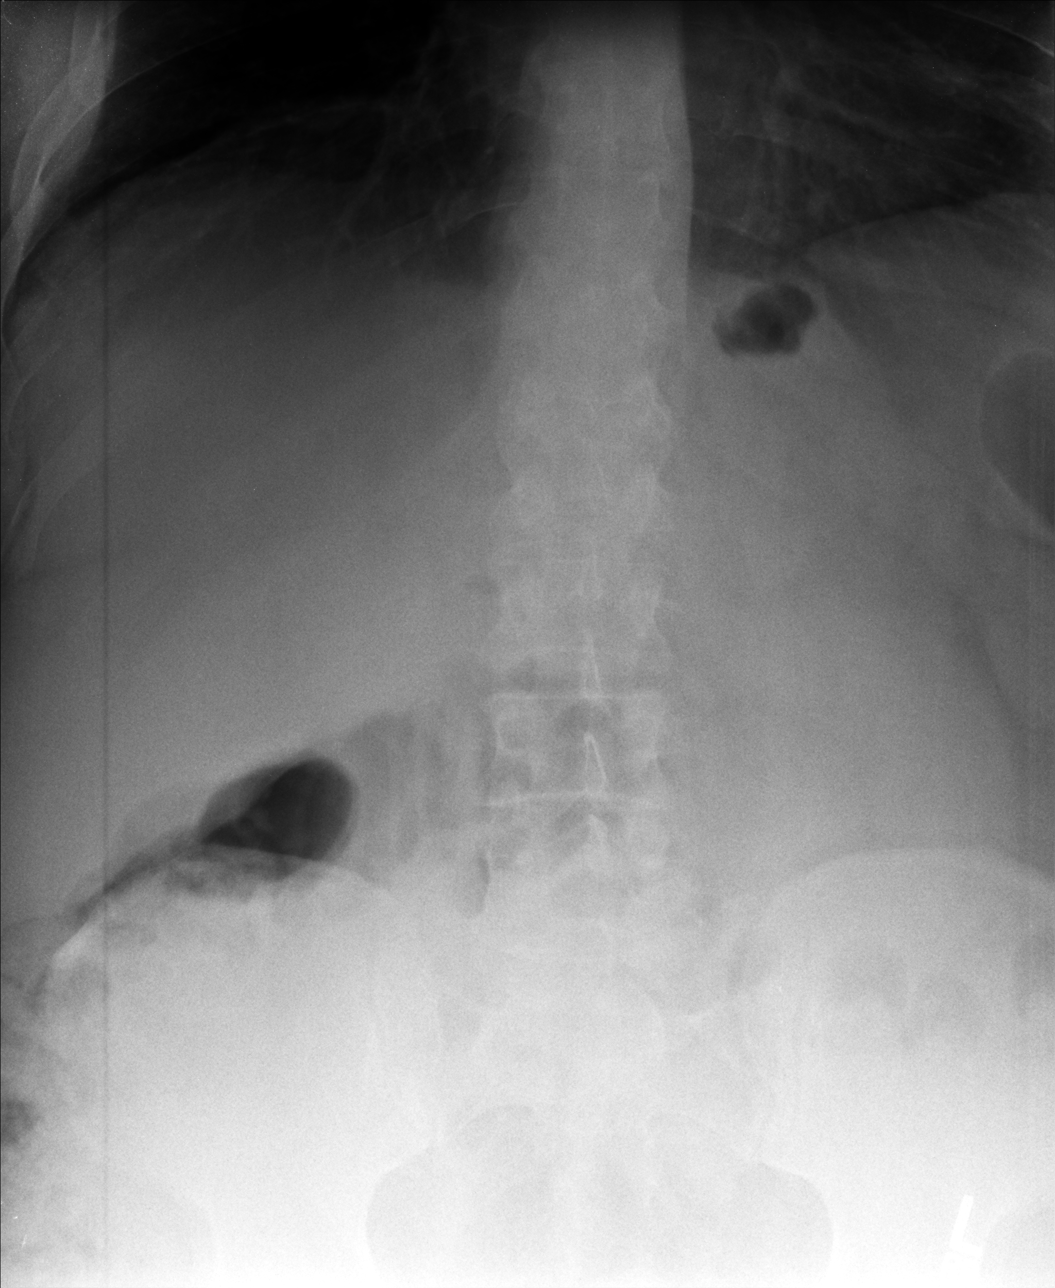

[AP (2 of 3)]
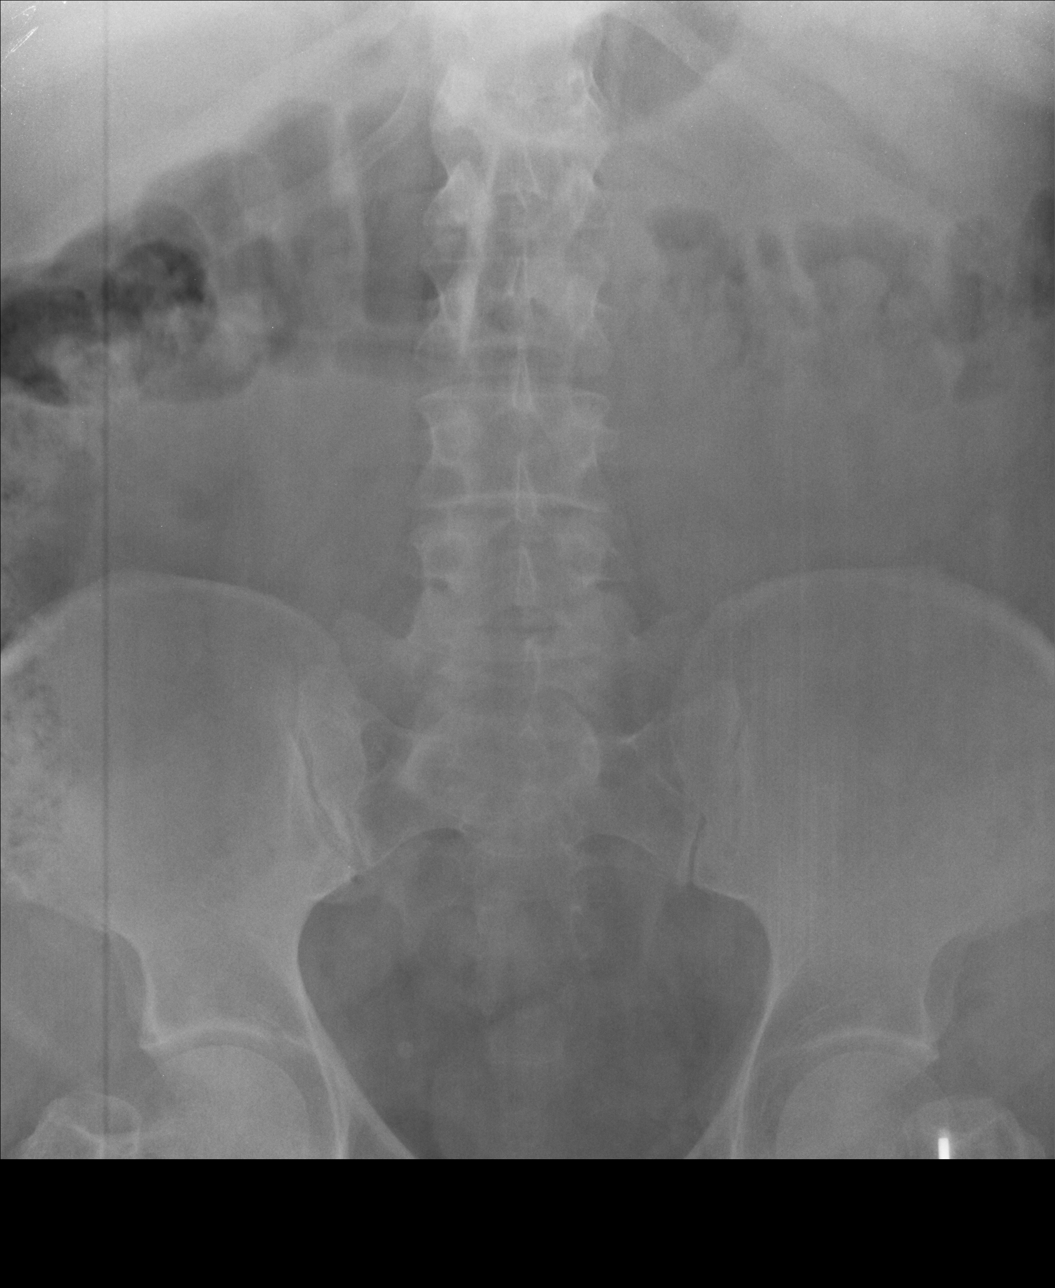

[AP (3 of 3)]
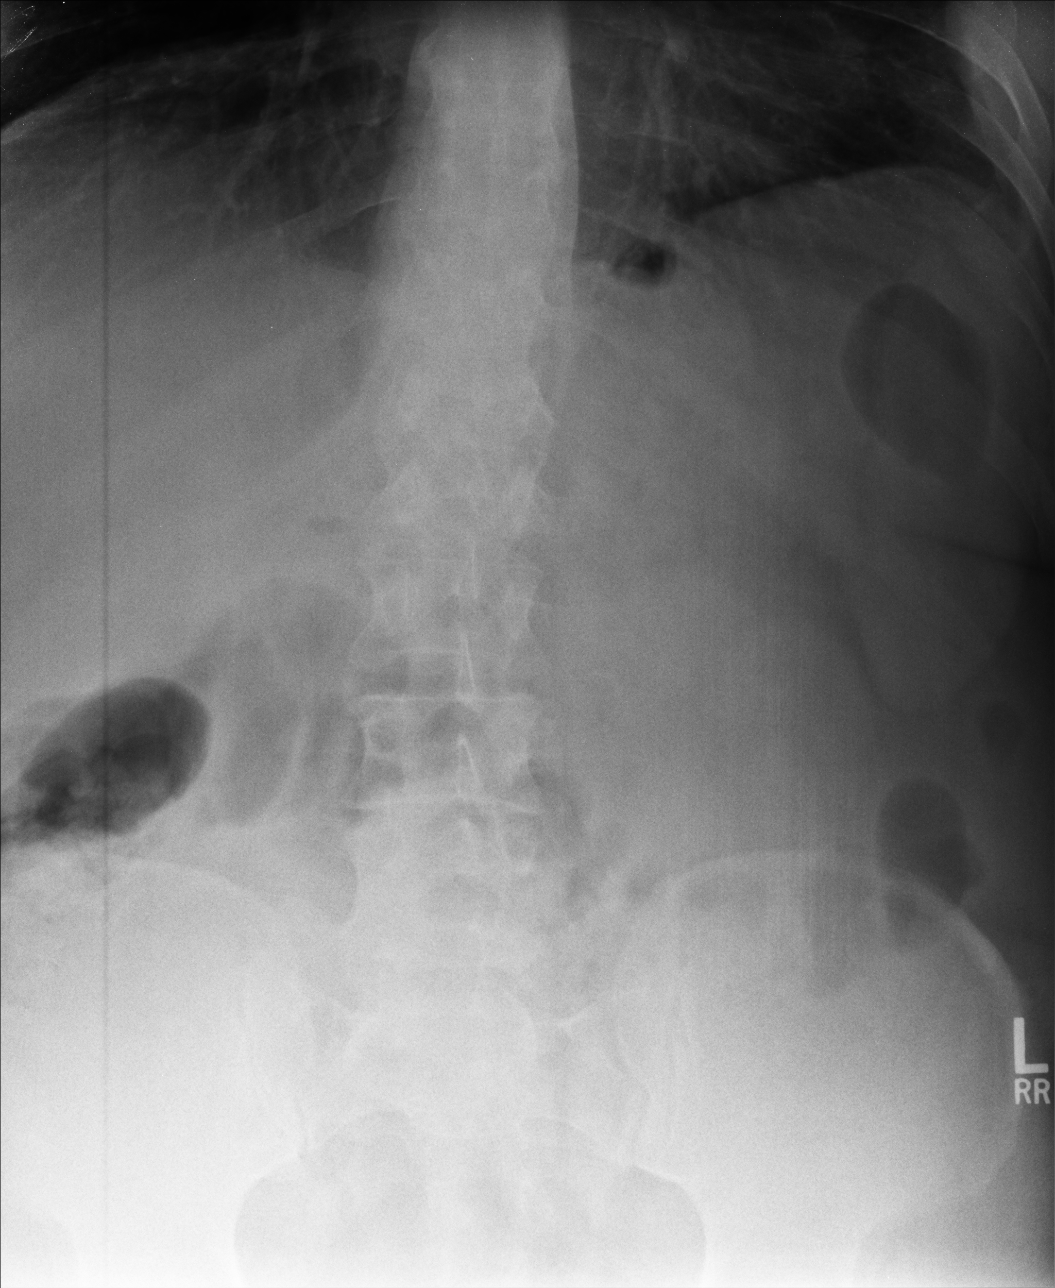

[4 of 4 positions shown; findings below may reference images not displayed]

FINDINGS: Examination is degraded due to patient body habitus.

Grossly unchanged enlarged cardiac silhouette and mediastinal
contours given persistently reduced lung volumes. Bilateral
infrahilar and left basilar heterogeneous opacities are unchanged
and favored to represent atelectasis versus scar. Pleural
parenchymal thickening about the left inferior lateral chest wall is
unchanged. No new focal airspace opacities. No pleural effusion or
pneumothorax. No evidence of edema.

Paucity of bowel gas without evidence of enteric obstruction. No
pneumoperitoneum, pneumatosis or portal venous gas.

No definite abnormal intra-abdominal calcifications.

No acute osseus abnormalities.
IMPRESSION: 1. Similar findings of cardiomegaly and bibasilar atelectasis
without acute cardiopulmonary disease.
2. Paucity of bowel gas without evidence of obstruction.
